# Patient Record
Sex: Male | Born: 1950 | Race: Black or African American | Hispanic: No | Marital: Single | Smoking: Former smoker
Health system: Southern US, Community
[De-identification: ages and names within clinical notes are randomized; demographics above are authoritative.]

## PROBLEM LIST (undated history)

## (undated) DIAGNOSIS — I10 Essential (primary) hypertension: Secondary | ICD-10-CM

---

## 2012-02-22 ENCOUNTER — Encounter (HOSPITAL_COMMUNITY): Payer: Self-pay | Admitting: Emergency Medicine

## 2012-02-22 ENCOUNTER — Emergency Department (HOSPITAL_COMMUNITY)
Admission: EM | Admit: 2012-02-22 | Discharge: 2012-02-22 | Disposition: A | Discharge status: Home / Self Care | Payer: Self-pay | Attending: Emergency Medicine | Admitting: Emergency Medicine

## 2012-02-22 DIAGNOSIS — W208XXA Other cause of strike by thrown, projected or falling object, initial encounter: Secondary | ICD-10-CM | POA: Insufficient documentation

## 2012-02-22 DIAGNOSIS — S0191XA Laceration without foreign body of unspecified part of head, initial encounter: Secondary | ICD-10-CM

## 2012-02-22 DIAGNOSIS — S0100XA Unspecified open wound of scalp, initial encounter: Secondary | ICD-10-CM | POA: Insufficient documentation

## 2012-02-22 NOTE — ED Provider Notes (Signed)
Medical screening examination/treatment/procedure(s) were performed by non-physician practitioner and as supervising physician I was immediately available for consultation/collaboration.   Cyndra Numbers, MD 02/22/12 [phone]

## 2012-02-22 NOTE — ED Notes (Signed)
Pt states he was working in his yard when a ladder fell and struck him on the back of the head. Pt denies loss of consciousness and was not knocked down. Pt with approximately 2" laceration to left posterior scalp, bleeding controlled at this time.  Pt's last Td >10 years ago however pt states he will refuse a Td booster today.

## 2012-02-22 NOTE — ED Provider Notes (Signed)
History     CSN: [id number]  Arrival date & time 02/22/12  1057   First MD Initiated Contact with Patient 02/22/12 1131      Chief Complaint  Patient presents with  . Head Laceration    (Consider location/radiation/quality/duration/timing/severity/associated sxs/prior treatment) HPI Comments: Patient reports he was doing yard work when a ladder fell from being propped up against a tree.  States he ran to get out of the way but it cut the top of his head.  Denies LOC. Denies any focal neurological deficits.  States that he cannot tolerate needles or anything that hurts, refuses tetanus vaccination and refuses repair of the wound with sutures or staples.    Patient is a 61 y.o. male presenting with scalp laceration. The history is provided by the patient.  Head Laceration This is a new problem. The current episode started today. Pertinent negatives include no headaches, numbness or weakness.    History reviewed. No pertinent past medical history.  History reviewed. No pertinent past surgical history.  History reviewed. No pertinent family history.  History  Substance Use Topics  . Smoking status: Not on file  . Smokeless tobacco: Not on file  . Alcohol Use: Not on file      Review of Systems  Skin: Positive for wound.  Neurological: Negative for syncope, weakness, light-headedness, numbness and headaches.    Allergies  Review of patient's allergies indicates no known allergies.  Home Medications  No current outpatient prescriptions on file.  BP 142/84  Pulse 79  Temp 97.9 F (36.6 C) (Oral)  Resp 18  SpO2 97%  Physical Exam  Nursing note and vitals reviewed. Constitutional: He appears well-developed and well-nourished. No distress.  HENT:  Head: Normocephalic. Head is with laceration.    Neck: Neck supple.  Pulmonary/Chest: Effort normal.  Neurological: He is alert. He has normal strength. No cranial nerve deficit or sensory deficit. He exhibits normal  muscle tone. Coordination normal. GCS eye subscore is 4. GCS verbal subscore is 5. GCS motor subscore is 6.  Skin: He is not diaphoretic.    ED Course  Procedures (including critical care time)  Labs Reviewed - No data to display No results found.      1. Laceration of head       MDM  Pt with laceration to head after running away from a falling ladder.  No neurological deficits. I do not think head imaging is necessary at this time.  After long discussion about the dangers of tetanus and of increased risk of infection and continuous bleeding by patient's refusal of tetanus vaccination and repair of wound, pt discharged home with return precautions.  Pt offered LET ointment prior to stapling and he still refused.  Nurse cleaned wound and applied steri-strips, which was not the best repair method but was the only one the patient would agree with.  Pt given return precautions.  Pt verbalizes understanding and agrees with plan.           [place], [place] 02/22/12 1220

## 2018-01-28 ENCOUNTER — Encounter (HOSPITAL_COMMUNITY): Payer: Self-pay | Admitting: *Deleted

## 2018-01-28 ENCOUNTER — Emergency Department (HOSPITAL_COMMUNITY)
Admission: EM | Admit: 2018-01-28 | Discharge: 2018-01-28 | Disposition: A | Discharge status: Home / Self Care | Payer: Self-pay | Attending: Emergency Medicine | Admitting: Emergency Medicine

## 2018-01-28 ENCOUNTER — Emergency Department (HOSPITAL_COMMUNITY): Payer: Self-pay

## 2018-01-28 DIAGNOSIS — M79605 Pain in left leg: Secondary | ICD-10-CM | POA: Insufficient documentation

## 2018-01-28 MED ORDER — HYDROMORPHONE HCL 1 MG/ML IJ SOLN
1.0000 mg | Freq: Once | INTRAMUSCULAR | Status: AC
  Administered 2018-01-28: 1 mg via INTRAMUSCULAR
  Filled 2018-01-28: qty 1

## 2018-01-28 MED ORDER — TRAMADOL HCL 50 MG PO TABS: 50.0000 mg | ORAL_TABLET | Freq: Four times a day (QID) | ORAL | 0 refills | Status: DC | PRN

## 2018-01-28 MED ORDER — KETOROLAC TROMETHAMINE 15 MG/ML IJ SOLN
15.0000 mg | Freq: Once | INTRAMUSCULAR | Status: AC
  Administered 2018-01-28: 15 mg via INTRAMUSCULAR
  Filled 2018-01-28: qty 1

## 2018-01-28 MED ORDER — HYDROCODONE-ACETAMINOPHEN 5-325 MG PO TABS
1.0000 | ORAL_TABLET | Freq: Once | ORAL | Status: AC
  Administered 2018-01-28: 1 via ORAL
  Filled 2018-01-28: qty 1

## 2018-01-28 MED ORDER — METHOCARBAMOL 500 MG PO TABS: 1000.0000 mg | ORAL_TABLET | Freq: Four times a day (QID) | ORAL | 0 refills | Status: DC

## 2018-01-28 MED ORDER — PREDNISONE 20 MG PO TABS: ORAL_TABLET | ORAL | 0 refills | Status: DC

## 2018-01-28 NOTE — ED Provider Notes (Signed)
[facility]-EMERGENCY DEPT Provider Note   CSN: [id number] Arrival date & time: 01/28/18  1006     History   Chief Complaint Chief Complaint  Patient presents with  . Leg Pain    HPI Dalton Parrish is a 67 y.o. male.  Patient brought in by ambulance with complaint of left leg pain.  Patient states that the pain started 2 days ago, acutely, when he stepped up on a ramp.  He had immediate pain starting from the outside of his left hip down the outside of his leg to his knee, described as throbbing and shooting.  No back pain.  He has had difficulty standing and ambulating due to pain today.  He is tried ibuprofen without improvement.  He denies numbness or tingling.  No leg swelling.  No bowel or bladder changes or loss of control.  No similar pain in the past. The onset of this condition was acute. The course is constant. Aggravating factors: none. Alleviating factors: certain positions.       History reviewed. No pertinent past medical history.  There are no active problems to display for this patient.   History reviewed. No pertinent surgical history.      Home Medications    Prior to Admission medications   Not on File    Family History No family history on file.  Social History Social History   Tobacco Use  . Smoking status: Not on file  Substance Use Topics  . Alcohol use: Not on file  . Drug use: Not on file     Allergies   Patient has no known allergies.   Review of Systems Review of Systems  Constitutional: Negative for activity change.  Cardiovascular: Negative for leg swelling.  Musculoskeletal: Positive for arthralgias, gait problem and myalgias. Negative for back pain, joint swelling and neck pain.  Skin: Negative for wound.  Neurological: Negative for weakness and numbness.     Physical Exam Updated Vital Signs BP (!) 143/93 (BP Location: Right Arm)   Pulse 87   Temp 97.8 F (36.6 C) (Oral)   Resp 18   SpO2 100%    Physical Exam  Constitutional: He appears well-developed and well-nourished.  HENT:  Head: Normocephalic and atraumatic.  Eyes: Conjunctivae are normal.  Neck: Normal range of motion.  Cardiovascular:  Pulses:      Dorsalis pedis pulses are 2+ on the right side, and 2+ on the left side.  Abdominal: Soft. There is no tenderness. There is no CVA tenderness.  Musculoskeletal: Normal range of motion.       Left hip: He exhibits tenderness. He exhibits normal range of motion, normal strength and no bony tenderness.       Left knee: He exhibits normal range of motion, no swelling and no effusion. No tenderness found.       Left ankle: Normal.       Cervical back: He exhibits normal range of motion, no tenderness and no bony tenderness.       Thoracic back: He exhibits normal range of motion, no tenderness and no bony tenderness.       Lumbar back: He exhibits normal range of motion, no tenderness and no bony tenderness.       Left upper leg: Normal. He exhibits no tenderness, no bony tenderness and no swelling.       Legs: No step-off noted with palpation of spine.   Neurological: He is alert. He has normal reflexes. No sensory deficit. He  exhibits normal muscle tone.  5/5 strength in entire lower extremities bilaterally. No sensation deficit.   Skin: Skin is warm and dry.  Psychiatric: He has a normal mood and affect.  Nursing note and vitals reviewed.    ED Treatments / Results  Labs (all labs ordered are listed, but only abnormal results are displayed) Labs Reviewed - No data to display  EKG None  Radiology Dg Hip Unilat W Or Wo Pelvis 2-3 Views Left  Result Date: 01/28/2018 CLINICAL DATA:  Left hip and lower extremity pain after stepping. EXAM: DG HIP (WITH OR WITHOUT PELVIS) 2-3V LEFT COMPARISON:  None. FINDINGS: No pelvic fracture or diastasis. Degenerative changes in the visualized lower lumbar spine. No left hip fracture or dislocation. No suspicious focal osseous lesion.  No significant hip arthropathy. IMPRESSION: No fracture. No left hip malalignment. No significant hip arthropathy. Degenerative changes in the visualized lower lumbar spine. Electronically Signed   By: Delbert PhenixJason A Poff M.D.   On: 01/28/2018 11:52    Procedures Procedures (including critical care time)  Medications Ordered in ED Medications  HYDROcodone-acetaminophen (NORCO/VICODIN) 5-325 MG per tablet 1 tablet (1 tablet Oral Given 01/28/18 1052)  ketorolac (TORADOL) 15 MG/ML injection 15 mg (15 mg Intramuscular Given 01/28/18 1055)  HYDROmorphone (DILAUDID) injection 1 mg (1 mg Intramuscular Given 01/28/18 1236)     Initial Impression / Assessment and Plan / ED Course  I have reviewed the triage vital signs and the nursing notes.  Pertinent labs & imaging results that were available during my care of the patient were reviewed by me and considered in my medical decision making (see chart for details).     Patient seen and examined. Work-up initiated. Medications ordered. No reproducible tenderness except over lateral pelvis, prox femur. Will image. Suspect MSK pain vs radicular pain. Doubt DVT given history and exam. Normal pedal pulses. No flank pain to suggest dissection/AAA.   Vital signs reviewed and are as follows: BP (!) 143/93 (BP Location: Right Arm)   Pulse 87   Temp 97.8 F (36.6 C) (Oral)   Resp 18   SpO2 100%   10:52 AM Pt unable to tolerate x-ray 2/2 pain. Awaiting pain med admin.   12:52 PM x-ray negative.  Patient updated.  He is still very uncomfortable.  IM Dilaudid ordered.  [facility] [employer] [facility] [employer] [facility] substance reporting [place].  No previous prescriptions in system.  2:50 PM Patient had improvement in pain.  We will discharged home with prednisone, tramadol, Robaxin.   Encouraged PCP follow-up in 1 week if not improving.  Patient counseled on use of narcotic pain medications. Counseled not to combine these medications with others containing tylenol. Urged not to drink  alcohol, drive, or perform any other activities that requires focus while taking these medications. The patient verbalizes understanding and agrees with the plan.    Final Clinical Impressions(s) / ED Diagnoses   Final diagnoses:  Left leg pain   Patient with acute onset of left leg pain after stepping awkwardly.  Most of his pain is located in the lateral hip area.  X-ray was performed which was negative.  Patient does not have any swelling or signs of DVT.  Symptoms may be musculoskeletal or possibly radicular nature.  Prednisone given for this reason his pain does shoot down the lateral part of his leg.  He does not have any overt back pain.  He appears well.  Lower extremity with distal circulation, motor, sensation intact.  No upper extremity symptoms.  ED Discharge Orders  Ordered    predniSONE (DELTASONE) 20 MG tablet     01/28/18 1421    methocarbamol (ROBAXIN) 500 MG tablet  4 times daily     01/28/18 1421    traMADol (ULTRAM) 50 MG tablet  Every 6 hours PRN     01/28/18 1421       Renne Crigler, PA-C 01/28/18 1452    Rolan Bucco, MD 01/28/18 1455

## 2018-01-28 NOTE — Discharge Instructions (Signed)
Please read and follow all provided instructions.  Your diagnoses today include:  1. Left leg pain     Tests performed today include:  Vital signs - see below for your results today   X-ray of your hip and pelvic - no broken bones  Medications prescribed:   Tramadol - narcotic-like pain medication  DO NOT drive or perform any activities that require you to be awake and alert because this medicine can make you drowsy.    Robaxin (methocarbamol) - muscle relaxer medication  DO NOT drive or perform any activities that require you to be awake and alert because this medicine can make you drowsy.    Prednisone - steroid medicine   It is best to take this medication in the morning to prevent sleeping problems. If you are diabetic, monitor your blood sugar closely and stop taking Prednisone if blood sugar is over 300. Take with food to prevent stomach upset.   Take any prescribed medications only as directed.  Home care instructions:   Follow any educational materials contained in this packet  Please rest, use ice or heat on your back for the next several days  Do not lift, push, pull anything more than 10 pounds for the next week  Follow-up instructions: Please follow-up with your primary care provider in the next 1 week for further evaluation of your symptoms.   Return instructions:  SEEK IMMEDIATE MEDICAL ATTENTION IF YOU HAVE:  New numbness, tingling, weakness, or problem with the use of your arms or legs  Severe back pain not relieved with medications  Loss control of your bowels or bladder  Increasing pain in any areas of the body (such as chest or abdominal pain)  Shortness of breath, dizziness, or fainting.   Worsening nausea (feeling sick to your stomach), vomiting, fever, or sweats  Any other emergent concerns regarding your health   Additional Information:  Your vital signs today were: BP (!) 114/105 (BP Location: Right Arm)    Pulse 86    Temp 97.8 F  (36.6 C) (Oral)    Resp 15    SpO2 95%  If your blood pressure (BP) was elevated above 135/85 this visit, please have this repeated by your doctor within one month. --------------

## 2018-01-28 NOTE — ED Triage Notes (Signed)
Per EMS, pt complains of left hip to knee pain x 2 days. Pain began when pt was walking down a ramp. Pt has tried ibuprofen w/o relief. Pt fell d/t leg pain, denies injury. Pt denies head injury or loss of consciousness.   BP 150/90 HR 90

## 2020-10-09 ENCOUNTER — Other Ambulatory Visit: Payer: Self-pay

## 2020-10-09 ENCOUNTER — Inpatient Hospital Stay (HOSPITAL_COMMUNITY)
Admission: EM | Admit: 2020-10-09 | Discharge: 2020-10-12 | DRG: 175 | Disposition: A | Discharge status: Home / Self Care | Payer: Medicare Other | Attending: Student | Admitting: Student

## 2020-10-09 ENCOUNTER — Encounter (HOSPITAL_COMMUNITY): Payer: Self-pay | Admitting: Emergency Medicine

## 2020-10-09 ENCOUNTER — Emergency Department (HOSPITAL_COMMUNITY): Payer: Medicare Other

## 2020-10-09 DIAGNOSIS — Z79899 Other long term (current) drug therapy: Secondary | ICD-10-CM | POA: Diagnosis not present

## 2020-10-09 DIAGNOSIS — R739 Hyperglycemia, unspecified: Secondary | ICD-10-CM | POA: Diagnosis not present

## 2020-10-09 DIAGNOSIS — Z6828 Body mass index (BMI) 28.0-28.9, adult: Secondary | ICD-10-CM

## 2020-10-09 DIAGNOSIS — I2699 Other pulmonary embolism without acute cor pulmonale: Secondary | ICD-10-CM | POA: Diagnosis not present

## 2020-10-09 DIAGNOSIS — I160 Hypertensive urgency: Secondary | ICD-10-CM | POA: Diagnosis present

## 2020-10-09 DIAGNOSIS — B9620 Unspecified Escherichia coli [E. coli] as the cause of diseases classified elsewhere: Secondary | ICD-10-CM | POA: Diagnosis present

## 2020-10-09 DIAGNOSIS — R030 Elevated blood-pressure reading, without diagnosis of hypertension: Secondary | ICD-10-CM | POA: Diagnosis present

## 2020-10-09 DIAGNOSIS — J9601 Acute respiratory failure with hypoxia: Secondary | ICD-10-CM | POA: Diagnosis present

## 2020-10-09 DIAGNOSIS — N3001 Acute cystitis with hematuria: Secondary | ICD-10-CM | POA: Diagnosis present

## 2020-10-09 DIAGNOSIS — Z20822 Contact with and (suspected) exposure to covid-19: Secondary | ICD-10-CM | POA: Diagnosis present

## 2020-10-09 DIAGNOSIS — N390 Urinary tract infection, site not specified: Secondary | ICD-10-CM | POA: Diagnosis present

## 2020-10-09 DIAGNOSIS — Z87891 Personal history of nicotine dependence: Secondary | ICD-10-CM | POA: Diagnosis not present

## 2020-10-09 DIAGNOSIS — Z7952 Long term (current) use of systemic steroids: Secondary | ICD-10-CM | POA: Diagnosis not present

## 2020-10-09 DIAGNOSIS — J9600 Acute respiratory failure, unspecified whether with hypoxia or hypercapnia: Secondary | ICD-10-CM | POA: Diagnosis present

## 2020-10-09 DIAGNOSIS — I10 Essential (primary) hypertension: Secondary | ICD-10-CM | POA: Diagnosis present

## 2020-10-09 DIAGNOSIS — J441 Chronic obstructive pulmonary disease with (acute) exacerbation: Secondary | ICD-10-CM | POA: Diagnosis present

## 2020-10-09 DIAGNOSIS — T380X5A Adverse effect of glucocorticoids and synthetic analogues, initial encounter: Secondary | ICD-10-CM | POA: Diagnosis not present

## 2020-10-09 DIAGNOSIS — Y92239 Unspecified place in hospital as the place of occurrence of the external cause: Secondary | ICD-10-CM | POA: Diagnosis not present

## 2020-10-09 DIAGNOSIS — E663 Overweight: Secondary | ICD-10-CM | POA: Diagnosis present

## 2020-10-09 LAB — TROPONIN I (HIGH SENSITIVITY)
Troponin I (High Sensitivity): 4 ng/L (ref <18)
Troponin I (High Sensitivity): 6 ng/L (ref <18)

## 2020-10-09 LAB — CBC
HCT: 40 % (ref 39.0–52.0)
Hemoglobin: 13.5 g/dL (ref 13.0–17.0)
MCH: 28.5 pg (ref 26.0–34.0)
MCHC: 33.8 g/dL (ref 30.0–36.0)
MCV: 84.4 fL (ref 80.0–100.0)
Platelets: 223 10*3/uL (ref 150–400)
RBC: 4.74 MIL/uL (ref 4.22–5.81)
RDW: 14.2 % (ref 11.5–15.5)
WBC: 10.7 10*3/uL — ABNORMAL HIGH (ref 4.0–10.5)
nRBC: 0 % (ref 0.0–0.2)

## 2020-10-09 LAB — URINALYSIS, ROUTINE W REFLEX MICROSCOPIC
Bilirubin Urine: NEGATIVE
Glucose, UA: NEGATIVE mg/dL
Ketones, ur: NEGATIVE mg/dL
Nitrite: POSITIVE — AB
Protein, ur: 30 mg/dL — AB
Specific Gravity, Urine: 1.013 (ref 1.005–1.030)
pH: 5 (ref 5.0–8.0)

## 2020-10-09 LAB — RESP PANEL BY RT-PCR (FLU A&B, COVID) ARPGX2
Influenza A by PCR: NEGATIVE
Influenza B by PCR: NEGATIVE
SARS Coronavirus 2 by RT PCR: NEGATIVE

## 2020-10-09 LAB — COMPREHENSIVE METABOLIC PANEL
ALT: 16 U/L (ref 0–44)
AST: 18 U/L (ref 15–41)
Albumin: 4.6 g/dL (ref 3.5–5.0)
Alkaline Phosphatase: 79 U/L (ref 38–126)
Anion gap: 13 (ref 5–15)
BUN: 16 mg/dL (ref 8–23)
CO2: 22 mmol/L (ref 22–32)
Calcium: 9.6 mg/dL (ref 8.9–10.3)
Chloride: 105 mmol/L (ref 98–111)
Creatinine, Ser: 1.16 mg/dL (ref 0.61–1.24)
GFR, Estimated: >60 mL/min (ref >60)
Glucose, Bld: 150 mg/dL — ABNORMAL HIGH (ref 70–99)
Potassium: 4 mmol/L (ref 3.5–5.1)
Sodium: 140 mmol/L (ref 135–145)
Total Bilirubin: 1.8 mg/dL — ABNORMAL HIGH (ref 0.3–1.2)
Total Protein: 9 g/dL — ABNORMAL HIGH (ref 6.5–8.1)

## 2020-10-09 LAB — PROTIME-INR
INR: 1.2 (ref 0.8–1.2)
Prothrombin Time: 14.8 seconds (ref 11.4–15.2)

## 2020-10-09 LAB — LIPASE, BLOOD: Lipase: 22 U/L (ref 11–51)

## 2020-10-09 LAB — APTT: aPTT: 33 seconds (ref 24–36)

## 2020-10-09 LAB — HEPARIN LEVEL (UNFRACTIONATED): Heparin Unfractionated: <0.1 IU/mL — ABNORMAL LOW (ref 0.30–0.70)

## 2020-10-09 LAB — LACTIC ACID, PLASMA
Lactic Acid, Venous: 2.4 mmol/L (ref 0.5–1.9)
Lactic Acid, Venous: 1.6 mmol/L (ref 0.5–1.9)

## 2020-10-09 MED ORDER — SODIUM CHLORIDE 0.9 % IV BOLUS
500.0000 mL | Freq: Once | INTRAVENOUS | Status: AC
  Administered 2020-10-09: 500 mL via INTRAVENOUS

## 2020-10-09 MED ORDER — MORPHINE SULFATE (PF) 2 MG/ML IV SOLN
1.0000 mg | INTRAVENOUS | Status: DC | PRN
Start: 2020-10-09 — End: 2020-10-12
  Administered 2020-10-10: 1 mg via INTRAVENOUS
  Filled 2020-10-09: qty 1

## 2020-10-09 MED ORDER — SODIUM CHLORIDE 0.9 % IV BOLUS
1000.0000 mL | Freq: Once | INTRAVENOUS | Status: AC
  Administered 2020-10-09: 1000 mL via INTRAVENOUS

## 2020-10-09 MED ORDER — HEPARIN BOLUS VIA INFUSION
2700.0000 [IU] | INTRAVENOUS | Status: AC
  Administered 2020-10-09: 2700 [IU] via INTRAVENOUS
  Filled 2020-10-09: qty 2700

## 2020-10-09 MED ORDER — MORPHINE SULFATE (PF) 4 MG/ML IV SOLN
4.0000 mg | Freq: Once | INTRAVENOUS | Status: AC
  Administered 2020-10-09: 4 mg via INTRAVENOUS
  Filled 2020-10-09: qty 1

## 2020-10-09 MED ORDER — FENTANYL CITRATE (PF) 100 MCG/2ML IJ SOLN
50.0000 ug | Freq: Once | INTRAMUSCULAR | Status: AC
  Administered 2020-10-09: 50 ug via INTRAVENOUS
  Filled 2020-10-09: qty 2

## 2020-10-09 MED ORDER — IOHEXOL 300 MG/ML  SOLN
100.0000 mL | Freq: Once | INTRAMUSCULAR | Status: AC | PRN
  Administered 2020-10-09: 100 mL via INTRAVENOUS

## 2020-10-09 MED ORDER — CHLORHEXIDINE GLUCONATE 0.12 % MT SOLN
15.0000 mL | Freq: Two times a day (BID) | OROMUCOSAL | Status: DC
  Administered 2020-10-10 – 2020-10-12 (×5): 15 mL via OROMUCOSAL
  Filled 2020-10-09 (×6): qty 15

## 2020-10-09 MED ORDER — ACETAMINOPHEN 325 MG PO TABS: 650.0000 mg | ORAL_TABLET | Freq: Four times a day (QID) | ORAL | Status: DC | PRN

## 2020-10-09 MED ORDER — POLYETHYLENE GLYCOL 3350 17 G PO PACK: 17.0000 g | PACK | Freq: Every day | ORAL | Status: DC | PRN

## 2020-10-09 MED ORDER — PREDNISONE 20 MG PO TABS
40.0000 mg | ORAL_TABLET | Freq: Every day | ORAL | Status: DC
  Administered 2020-10-11 – 2020-10-12 (×2): 40 mg via ORAL
  Filled 2020-10-09 (×2): qty 2

## 2020-10-09 MED ORDER — LABETALOL HCL 5 MG/ML IV SOLN
20.0000 mg | Freq: Once | INTRAVENOUS | Status: AC
  Administered 2020-10-09: 20 mg via INTRAVENOUS
  Filled 2020-10-09: qty 4

## 2020-10-09 MED ORDER — IOHEXOL 350 MG/ML SOLN
75.0000 mL | Freq: Once | INTRAVENOUS | Status: AC | PRN
  Administered 2020-10-09: 75 mL via INTRAVENOUS

## 2020-10-09 MED ORDER — ACETAMINOPHEN 650 MG RE SUPP: 650.0000 mg | Freq: Four times a day (QID) | RECTAL | Status: DC | PRN

## 2020-10-09 MED ORDER — IPRATROPIUM-ALBUTEROL 0.5-2.5 (3) MG/3ML IN SOLN
3.0000 mL | Freq: Four times a day (QID) | RESPIRATORY_TRACT | Status: DC
  Administered 2020-10-09 – 2020-10-10 (×5): 3 mL via RESPIRATORY_TRACT
  Filled 2020-10-09 (×5): qty 3

## 2020-10-09 MED ORDER — METOPROLOL TARTRATE 25 MG PO TABS
25.0000 mg | ORAL_TABLET | Freq: Two times a day (BID) | ORAL | Status: DC
  Administered 2020-10-09 – 2020-10-12 (×6): 25 mg via ORAL
  Filled 2020-10-09 (×6): qty 1

## 2020-10-09 MED ORDER — METHYLPREDNISOLONE SODIUM SUCC 125 MG IJ SOLR
60.0000 mg | Freq: Four times a day (QID) | INTRAMUSCULAR | Status: AC
  Administered 2020-10-09 – 2020-10-10 (×4): 60 mg via INTRAVENOUS
  Filled 2020-10-09 (×4): qty 2

## 2020-10-09 MED ORDER — ORAL CARE MOUTH RINSE
15.0000 mL | Freq: Two times a day (BID) | OROMUCOSAL | Status: DC
  Administered 2020-10-10 – 2020-10-11 (×3): 15 mL via OROMUCOSAL

## 2020-10-09 MED ORDER — HYDROCODONE-ACETAMINOPHEN 5-325 MG PO TABS
1.0000 | ORAL_TABLET | ORAL | Status: DC | PRN
Start: 2020-10-09 — End: 2020-10-12
  Administered 2020-10-10: 2 via ORAL
  Filled 2020-10-09: qty 2

## 2020-10-09 MED ORDER — HEPARIN (PORCINE) 25000 UT/250ML-% IV SOLN
1900.0000 [IU]/h | INTRAVENOUS | Status: DC
  Administered 2020-10-09: 1550 [IU]/h via INTRAVENOUS
  Administered 2020-10-10: 1900 [IU]/h via INTRAVENOUS
  Filled 2020-10-09 (×3): qty 250

## 2020-10-09 MED ORDER — LOSARTAN POTASSIUM 50 MG PO TABS
50.0000 mg | ORAL_TABLET | Freq: Every day | ORAL | Status: DC
  Administered 2020-10-09 – 2020-10-12 (×4): 50 mg via ORAL
  Filled 2020-10-09 (×4): qty 1

## 2020-10-09 MED ORDER — SODIUM CHLORIDE 0.9 % IV SOLN
1.0000 g | Freq: Once | INTRAVENOUS | Status: AC
  Administered 2020-10-09: 1 g via INTRAVENOUS
  Filled 2020-10-09: qty 10

## 2020-10-09 MED ORDER — SODIUM CHLORIDE 0.9 % IV SOLN
1.0000 g | INTRAVENOUS | Status: DC
  Administered 2020-10-10 – 2020-10-11 (×2): 1 g via INTRAVENOUS
  Filled 2020-10-09: qty 10
  Filled 2020-10-09: qty 0.05

## 2020-10-09 NOTE — ED Provider Notes (Signed)
Care assumed from [facility], [place] at shift change with CTA pending.   In brief, this patient is a 70 y.o. M who presents for evaluation of right-sided flank pain that has been constant in nature.  He states it radiates to his abdomen.  No alleviating or aggravating factors.  He denies any fevers, chest pain, shortness of breath, nausea/vomiting.  Please see note from previous provider for full history/physical exam.  Physical Exam  BP (!) 158/101   Pulse 96   Temp 98.3 F (36.8 C) (Oral)   Resp (!) 39   Ht 6' (1.829 m)   Wt 95.3 kg   SpO2 96%   BMI 28.48 kg/m   Physical Exam  ED Course/Procedures     Procedures   Results for orders placed or performed during the hospital encounter of 10/09/20 (from the past 24 hour(s))  Lipase, blood     Status: None   Collection Time: 10/09/20 12:35 PM  Result Value Ref Range   Lipase 22 11 - 51 U/L  Comprehensive metabolic panel     Status: Abnormal   Collection Time: 10/09/20 12:35 PM  Result Value Ref Range   Sodium 140 135 - 145 mmol/L   Potassium 4.0 3.5 - 5.1 mmol/L   Chloride 105 98 - 111 mmol/L   CO2 22 22 - 32 mmol/L   Glucose, Bld 150 (H) 70 - 99 mg/dL   BUN 16 8 - 23 mg/dL   Creatinine, Ser 0.34 0.61 - 1.24 mg/dL   Calcium 9.6 8.9 - 74.2 mg/dL   Total Protein 9.0 (H) 6.5 - 8.1 g/dL   Albumin 4.6 3.5 - 5.0 g/dL   AST 18 15 - 41 U/L   ALT 16 0 - 44 U/L   Alkaline Phosphatase 79 38 - 126 U/L   Total Bilirubin 1.8 (H) 0.3 - 1.2 mg/dL   GFR, Estimated >59 >56 mL/min   Anion gap 13 5 - 15  CBC     Status: Abnormal   Collection Time: 10/09/20 12:35 PM  Result Value Ref Range   WBC 10.7 (H) 4.0 - 10.5 K/uL   RBC 4.74 4.22 - 5.81 MIL/uL   Hemoglobin 13.5 13.0 - 17.0 g/dL   HCT 38.7 56.4 - 33.2 %   MCV 84.4 80.0 - 100.0 fL   MCH 28.5 26.0 - 34.0 pg   MCHC 33.8 30.0 - 36.0 g/dL   RDW 95.1 88.4 - 16.6 %   Platelets 223 150 - 400 K/uL   nRBC 0.0 0.0 - 0.2 %  Troponin I (High Sensitivity)     Status: None   Collection  Time: 10/09/20 12:35 PM  Result Value Ref Range   Troponin I (High Sensitivity) 4 <18 ng/L  Urinalysis, Routine w reflex microscopic Urine, Clean Catch     Status: Abnormal   Collection Time: 10/09/20  1:11 PM  Result Value Ref Range   Color, Urine YELLOW YELLOW   APPearance HAZY (A) CLEAR   Specific Gravity, Urine 1.013 1.005 - 1.030   pH 5.0 5.0 - 8.0   Glucose, UA NEGATIVE NEGATIVE mg/dL   Hgb urine dipstick SMALL (A) NEGATIVE   Bilirubin Urine NEGATIVE NEGATIVE   Ketones, ur NEGATIVE NEGATIVE mg/dL   Protein, ur 30 (A) NEGATIVE mg/dL   Nitrite POSITIVE (A) NEGATIVE   Leukocytes,Ua TRACE (A) NEGATIVE   RBC / HPF 0-5 0 - 5 RBC/hpf   WBC, UA 11-20 0 - 5 WBC/hpf   Bacteria, UA MANY (A) NONE SEEN  APTT     Status: None   Collection Time: 10/09/20  3:30 PM  Result Value Ref Range   aPTT 33 24 - 36 seconds  Protime-INR     Status: None   Collection Time: 10/09/20  3:30 PM  Result Value Ref Range   Prothrombin Time 14.8 11.4 - 15.2 seconds   INR 1.2 0.8 - 1.2  Resp Panel by RT-PCR (Flu A&B, Covid) Nasopharyngeal Swab     Status: None   Collection Time: 10/09/20  3:32 PM   Specimen: Nasopharyngeal Swab; Nasopharyngeal(NP) swabs in vial transport medium  Result Value Ref Range   SARS Coronavirus 2 by RT PCR NEGATIVE NEGATIVE   Influenza A by PCR NEGATIVE NEGATIVE   Influenza B by PCR NEGATIVE NEGATIVE  Lactic acid, plasma     Status: Abnormal   Collection Time: 10/09/20  3:38 PM  Result Value Ref Range   Lactic Acid, Venous 2.4 (HH) 0.5 - 1.9 mmol/L    MDM    PLAN: Patient will be admitted pending CTA of chest.  He has been maintaining good O2 sats here in the ED.  He was placed on oxygen for comfort care.  MDM:  Theodis Aguas is negative.  UA shows nitrites, leukocytes, bacteria.  Patient started on Rocephin.  CMP is unremarkable.  Lipase is unremarkable.  CBC shows leukocytosis of 10.7.  CTA of chest shows pulmonary embolism without evidence of saddle embolus or right heart  strain.  Patient started on heparin.  We will plan to admit.  Discussed patient with Dr. Isidoro Donning (hospitalist) who accepts patient for admission.   1. Acute cystitis with hematuria   2. Acute pulmonary embolism, unspecified pulmonary embolism type, unspecified whether acute cor pulmonale present [facility])    Portions of this note were generated with Dragon dictation software. Dictation errors may occur despite best attempts at proofreading.   Maxwell Caul, PA-C 10/09/20 1656    Milagros Loll, MD 10/13/20 [phone]

## 2020-10-09 NOTE — H&P (Signed)
History and Physical        Hospital Admission Note Date: 10/09/2020  Patient name: Dalton Parrish Medical record number: [id number] Date of birth: 02/19/1951 Age: 70 y.o. Gender: male  PCP: Patient, No Pcp Per, patient has no PCP    Patient coming from:  home  I have reviewed all records in the [facility].    Chief Complaint:  Right-sided flank pain and pleuritic lower chest pain  HPI: Patient is a 70 year old male with prior history of hypertension, has not been going to primary care, not on any meds stated that he has chronic right-sided flank pain however it got worse 2 days ago.  States no nausea vomiting or diarrhea but noticed that the pain was worse with coughing or deep breathing.  He described the pain as constant sharp pain radiating down to his lower abdomen, pain has been gradually worsening, currently 8/10.  No dysuria or hematuria.  No fevers. Denies any personal history of clots, DVT or PE.  No long distance car travel or flights.  States he is otherwise functionally active.  Denies any family history of clots. States he did not get Covid vaccine and does not know if he ever had Covid  ED work-up/course:  Vital signs temp 98.3, respiratory rate 35, pulse 101-104, BP 161/107  Sodium 140, glucose 150, creatinine 1.16, LFTs normal, lipase 22.  WBC 7.7, hemoglobin 13.5 INR 1.2 UA positive for UTI COVID-19 test negative CTA chest showed marked severity right-sided pulmonary embolism without evidence of saddle embolus or right heart strain.  Mild to moderate severe edema bilateral atelectasis and/or infiltrates, emphysema, small hiatal hernia  CT abdomen pelvis negative for acute intra-abdominal process  Review of Systems: Positives marked in 'bold' Constitutional: Denies fever, chills, diaphoresis, poor appetite and fatigue.  HEENT: Denies photophobia, eye  pain, redness, hearing loss, ear pain, congestion, sore throat, rhinorrhea, sneezing, mouth sores, trouble swallowing, neck pain, neck stiffness and tinnitus.   Respiratory:+ Wheezing with pleuritic chest pain .   Cardiovascular: Denies  palpitations and leg swelling.  Gastrointestinal: Denies nausea, vomiting, diarrhea, constipation, blood in stool and abdominal distention. + Right sided flank pain Genitourinary: Denies dysuria, urgency, frequency, hematuria, flank pain and difficulty urinating.  Musculoskeletal: Denies myalgias, back pain, joint swelling, arthralgias and gait problem.  Skin: Denies pallor, rash and wound.  Neurological: Denies dizziness, seizures, syncope, weakness, light-headedness, numbness and headaches.  Hematological: Denies adenopathy. Easy bruising, personal or family bleeding history  Psychiatric/Behavioral: Denies suicidal ideation, mood changes, confusion, nervousness, sleep disturbance and agitation  Past Medical History: Patient states he had history of hypertension but does not take any meds.  He does not follow primary care.  Past surgical history History reviewed. No pertinent surgical history.  Medications: Prior to Admission medications   Medication Sig Start Date End Date Taking? Authorizing Provider  methocarbamol (ROBAXIN) 500 MG tablet Take 2 tablets (1,000 mg total) by mouth 4 (four) times daily. Patient not taking: No sig reported 01/28/18   Renne Crigler, PA-C  predniSONE (DELTASONE) 20 MG tablet 3 Tabs PO Days 1-3, then 2 tabs PO Days 4-6, then 1 tab PO Day 7-9, then Half Tab PO Day 10-12  Patient not taking: No sig reported 01/28/18   Renne CriglerGeiple, Joshua, PA-C  traMADol (ULTRAM) 50 MG tablet Take 1 tablet (50 mg total) by mouth every 6 (six) hours as needed. Patient not taking: No sig reported 01/28/18   Renne CriglerGeiple, Joshua, PA-C    Allergies:  No Known Allergies  Social History: Patient reports smoking, states sometimes he heavily smokes like a freight  train  reports previous alcohol use. He reports previous drug use.   Family History: States he has history of lung cancer on his father's side but he does not know much family history   Physical Exam: Blood pressure (!) 158/101, pulse 96, temperature 98.3 F (36.8 C), temperature source Oral, resp. rate (!) 39, height 6' (1.829 m), weight 95.3 kg, SpO2 96 %. General: Alert, awake, oriented x3, in mild distress due to pain Eyes: pink conjunctiva,anicteric sclera, pupils equal and reactive to light and accomodation, HEENT: normocephalic, atraumatic, oropharynx clear Neck: supple, no masses or lymphadenopathy, no goiter, no bruits, no JVD CVS: Regular rate and rhythm, without murmurs, rubs or gallops. No lower extremity edema Resp : Diminished breath sounds throughout with slight expiratory wheezing GI : Mild generalized abdominal TTP, no guarding or rebound tenderness, positive bowel sounds Musculoskeletal: No clubbing or cyanosis, positive pedal pulses. No contracture. ROM intact  Neuro: Grossly intact, no focal neurological deficits, strength 5/5 upper and lower extremities bilaterally Psych: alert and oriented x 3, normal mood and affect Skin: no rashes or lesions, warm and dry   LABS on Admission: I have personally reviewed all the labs and imagings below    Basic Metabolic Panel: Recent Labs  Lab 10/09/20 1235  NA 140  K 4.0  CL 105  CO2 22  GLUCOSE 150*  BUN 16  CREATININE 1.16  CALCIUM 9.6   Liver Function Tests: Recent Labs  Lab 10/09/20 1235  AST 18  ALT 16  ALKPHOS 79  BILITOT 1.8*  PROT 9.0*  ALBUMIN 4.6   Recent Labs  Lab 10/09/20 1235  LIPASE 22   No results for input(s): AMMONIA in the last 168 hours. CBC: Recent Labs  Lab 10/09/20 1235  WBC 10.7*  HGB 13.5  HCT 40.0  MCV 84.4  PLT 223   Cardiac Enzymes: No results for input(s): CKTOTAL, CKMB, CKMBINDEX, TROPONINI in the last 168 hours. BNP: Invalid input(s): POCBNP CBG: No results for  input(s): GLUCAP in the last 168 hours.  Radiological Exams on Admission:  CT Angio Chest PE W and/or Wo Contrast  Result Date: 10/09/2020 CLINICAL DATA:  Shortness of breath and wheezing. EXAM: CT ANGIOGRAPHY CHEST WITH CONTRAST TECHNIQUE: Multidetector CT imaging of the chest was performed using the standard protocol during bolus administration of intravenous contrast. Multiplanar CT image reconstructions and MIPs were obtained to evaluate the vascular anatomy. CONTRAST:  75mL OMNIPAQUE IOHEXOL 350 MG/ML SOLN COMPARISON:  None. FINDINGS: Cardiovascular: The thoracic aorta is normal in appearance. Marked severity intraluminal filling defects are seen involving the distal aspect of the right pulmonary artery. Extensive involvement of the middle lobe and lower lobe branches is seen. There is no evidence of saddle embolus. Normal heart size without evidence of right heart strain. No pericardial effusion. Mediastinum/Nodes: No enlarged mediastinal, hilar, or axillary lymph nodes. Thyroid gland, trachea, and esophagus demonstrate no significant findings. Lungs/Pleura: There is mild emphysematous lung disease. Mild areas of atelectasis and/or infiltrate are seen within the inferior aspect of the left upper lobe and posterior aspects of the right upper lobe, right middle lobe and left  lung base. Moderate severity atelectasis and/or infiltrate is seen within the right lower lobe. There is no evidence of a pleural effusion or pneumothorax. Upper Abdomen: There is a small hiatal hernia. Musculoskeletal: A 10.5 cm x 3.2 cm subcutaneous lipoma is seen within the soft tissues of the back, to the left of midline. Degenerative changes seen throughout the thoracic spine. Review of the MIP images confirms the above findings. IMPRESSION: 1. Marked severity right-sided pulmonary embolism without evidence of saddle embolus or right heart strain. 2. Mild to moderate severity bilateral atelectasis and/or infiltrate. 3. Small hiatal  hernia. 4. Emphysema. Emphysema (ICD10-J43.9). Electronically Signed   By: Aram Candela M.D.   On: 10/09/2020 16:02   CT ABDOMEN PELVIS W CONTRAST  Result Date: 10/09/2020 CLINICAL DATA:  70 year old male with abdominal infection suspected EXAM: CT ABDOMEN AND PELVIS WITH CONTRAST TECHNIQUE: Multidetector CT imaging of the abdomen and pelvis was performed using the standard protocol following bolus administration of intravenous contrast. CONTRAST:  OMNIPAQUE IOHEXOL 300 MG/ML  SOLN COMPARISON:  None. FINDINGS: Lower chest: Peripheral nodular opacities at the right costophrenic sulcus, within the right lower lobe. Partial volume loss of the right lower lobe and the right middle lobe. Questionable hypodensity linear filling defects within the pulmonary arteries of the right lower lobe. These are better appreciated on the coronal images. Hepatobiliary: Subcentimeter rounded hypodensity of the right liver dome, most likely a benign biliary cyst though nonspecific. Gallbladder is decompressed. No inflammatory changes at the gallbladder fossa. Pancreas: Unremarkable pancreas Spleen: Unremarkable spleen Adrenals/Urinary Tract: - Right adrenal gland:  Unremarkable - Left adrenal gland: Unremarkable. - Right kidney: No hydronephrosis, nephrolithiasis, inflammation, or ureteral dilation. Subcentimeter hypodense lesion at the anterior cortex of the right kidney, most likely a benign cyst though strictly too small to characterize. - Left Kidney: No hydronephrosis, nephrolithiasis, inflammation, or ureteral dilation. Subcentimeter hypodense lesion at the posterior cortex of the left kidney at the hilum, most likely benign cyst though strictly too small to characterize. - Urinary Bladder: Unremarkable. Stomach/Bowel: - Stomach: Stomach demonstrates some gaseous distension though otherwise unremarkable. Hiatal hernia. No evidence of gastric outlet obstruction. - Small bowel: Gas filled small bowel loops distally  without transition point or wall thickening. No focal inflammatory changes or interloop fluid. - Appendix: Normal. - Colon: Formed stool within right colon sigmoid colon and rectum. Mild gaseous distension of the transverse colon. No focal wall thickening or focal inflammatory changes. Vascular/Lymphatic: Atherosclerotic changes of the abdominal aorta and the iliac arteries. Bilateral iliac arteries and proximal femoral arteries are patent. Reproductive: Unremarkable prostate Other: Bilateral fat containing inguinal hernia. Fat containing umbilical hernia without inflammatory changes or evidence of obstruction. Musculoskeletal: Degenerative changes of the visualized thoracolumbar spine. No acute displaced fracture. Vacuum disc phenomenon at L2-L3, L4-L5, L5-S1. IMPRESSION: CT negative for acute intra-abdominal process. Incidentally imaged filling defects of the right lower lobe and right middle lobe, which are favored to represent pulmonary emboli and associated right middle lobe/lower lobe consolidation. Further evaluation with a dedicated CTA chest PE protocol is indicated for confirmation. These results were discussed by telephone at the time of interpretation on 10/09/2020 at 2:56 pm with [facility],. Borderline dilated small bowel and colon, without evidence of obstruction. This is nonspecific, potentially representing enteritis, slow transit of bowel contents, in/or reflective of constipation/obstipation. Electronically Signed   By: Gilmer Mor D.O.   On: 10/09/2020 15:05      EKG: Independently reviewed.  Rate 107, sinus tachycardia, LVH*   Assessment/Plan Principal Problem:  Acute respiratory failure (HCC) secondary to right-sided pulmonary embolism -CTA chest showed moderate severity right-sided pulmonary embolism without evidence of saddle embolus or right heart strain -Currently placed on O2 3 L, started on IV heparin drip per pharmacy -No provoking risk factors for PE -Obtain 2D  echocardiogram and venous Dopplers LE for further work-up  Active Problems:    Acute lower UTI -Follow urine culture and sensitivities, placed on IV Rocephin  Nicotine abuse, COPD exacerbation -On exam, diminished breath sounds with slight expiratory wheezing bilaterally, has a smoking history -Placed on IV Solu-Medrol, currently on IV Rocephin for UTI, placed on scheduled DuoNebs    Accelerated hypertension -Patient had a history of hypertension but not taking any meds -Labetalol 20 mg IV x1, placed on losartan 50 mg daily, metoprolol 25 mg BID  Hyperglycemia -CBG 150, no prior history of diabetes -Obtain hemoglobin A1c in a.m. -Follow lipid panel in a.m.   DVT prophylaxis: On therapeutic IV heparin drip  CODE STATUS: Discussed in detail with the patient, on full CODE STATUS  Consults called: None  Family Communication: Admission, patients condition and plan of care including tests being ordered have been discussed with the patient who indicates understanding and agree with the plan and Code Status  Admission status:   The medical decision making on this patient was of high complexity and the patient is at high risk for clinical deterioration, therefore this is a level 3 admission.  Severity of Illness:      The appropriate patient status for this patient is INPATIENT. Inpatient status is judged to be reasonable and necessary in order to provide the required intensity of service to ensure the patient's safety. The patient's presenting symptoms, physical exam findings, and initial radiographic and laboratory data in the context of their chronic comorbidities is felt to place them at high risk for further clinical deterioration. Furthermore, it is not anticipated that the patient will be medically stable for discharge from the hospital within 2 midnights of admission. The following factors support the patient status of inpatient.   " The patient's presenting symptoms include  pleuritic chest pain, found to have moderately severe right-sided pulmonary embolism. " The worrisome physical exam findings include acute pulmonary embolism, hypoxia, wheezing bilaterally " The initial radiographic and laboratory data are worrisome because of CT angiogram positive for acute PE " The chronic co-morbidities include none, patient does not go to primary care   * I certify that at the point of admission it is my clinical judgment that the patient will require inpatient hospital care spanning beyond 2 midnights from the point of admission due to high intensity of service, high risk for further deterioration and high frequency of surveillance required.*    Time Spent on Admission: 65 minutes     Ellery Tash M.D. [facility] 10/09/2020, 5:07 PM

## 2020-10-09 NOTE — ED Triage Notes (Signed)
Per pt, states abdominal pain radiating to right flank-symptoms started Saturday--no dysuria, no N/V/D

## 2020-10-09 NOTE — Progress Notes (Signed)
ANTICOAGULATION CONSULT NOTE - Initial Consult  Pharmacy Consult for Heparin Indication: pulmonary embolus  No Known Allergies  Patient Measurements: Height: 6' (182.9 cm) Weight: 95.3 kg (210 lb) IBW/kg (Calculated) : 77.6 Heparin Dosing Weight: TBW  Vital Signs: Temp: 98.3 F (36.8 C) (03/14 1142) Temp Source: Oral (03/14 1142) BP: 152/113 (03/14 1419) Pulse Rate: 101 (03/14 1419)  Labs: Recent Labs    10/09/20 1235  HGB 13.5  HCT 40.0  PLT 223  CREATININE 1.16    Estimated Creatinine Clearance: 74 mL/min (by C-G formula based on SCr of 1.16 mg/dL).   Medical History: History reviewed. No pertinent past medical history.  Medications:  Scheduled:   Infusions:  . cefTRIAXone (ROCEPHIN)  IV    . sodium chloride      Assessment: 63 yoM admitted on 3/14 with abdominal pain and CT abdomen incidentally shows RLL and RML filling defects which may represent PE.  CTa and baseline coags pending.  No known use of prior to admission anticoagulation.  Baseline CBC wnl.   Goal of Therapy:  Heparin level 0.3-0.7 units/ml Monitor platelets by anticoagulation protocol: Yes   Plan:  Baseline PTT, PT/INR Give heparin 2700 units bolus IV x 1 Start heparin IV infusion at 1550 units/hr Heparin level 6 hours after starting Daily heparin level and CBC   Lynann Beaver PharmD, BCPS Clinical Pharmacist [facility] main pharmacy [phone] 10/09/2020 3:13 PM

## 2020-10-09 NOTE — Plan of Care (Signed)

## 2020-10-09 NOTE — ED Provider Notes (Signed)
[facility]-EMERGENCY DEPT Provider Note   CSN: [id number] Arrival date & time: 10/09/20  1135     History Chief Complaint  Patient presents with  . Abdominal Pain    Dalton Parrish is a 70 y.o. male reports himself as otherwise healthy no daily medication use.  Patient reports 2 days ago he developed right-sided flank pain it was a constant sharp pain radiating down to his lower abdomen no clear inciting event.  He denies any known aggravating or alleviating factors.  He reports pain is gradually worsened he now rates his pain as severe.  Patient denies similar pain in the past.  Denies fever/chills, chest pain/sob, nausea/vomiting, diarrhea, testicular pain/swelling, dysuria/hematuria, fall/injury or any additional concerns.  HPI     History reviewed. No pertinent past medical history.  There are no problems to display for this patient.   History reviewed. No pertinent surgical history.     No family history on file.  Social History   Substance Use Topics  . Alcohol use: Not Currently  . Drug use: Not Currently    Home Medications Prior to Admission medications   Medication Sig Start Date End Date Taking? Authorizing Provider  methocarbamol (ROBAXIN) 500 MG tablet Take 2 tablets (1,000 mg total) by mouth 4 (four) times daily. 01/28/18   Renne CriglerGeiple, Joshua, PA-C  predniSONE (DELTASONE) 20 MG tablet 3 Tabs PO Days 1-3, then 2 tabs PO Days 4-6, then 1 tab PO Day 7-9, then Half Tab PO Day 10-12 01/28/18   Renne CriglerGeiple, Joshua, PA-C  traMADol (ULTRAM) 50 MG tablet Take 1 tablet (50 mg total) by mouth every 6 (six) hours as needed. 01/28/18   Renne CriglerGeiple, Joshua, PA-C    Allergies    Patient has no known allergies.  Review of Systems   Review of Systems Ten systems are reviewed and are negative for acute change except as noted in the HPI  Physical Exam Updated Vital Signs BP (!) 158/101   Pulse (!) 104   Temp 98.3 F (36.8 C) (Oral)   Resp (!) 39   Ht 6' (1.829  m)   Wt 95.3 kg   SpO2 94%   BMI 28.48 kg/m   Physical Exam Constitutional:      General: He is not in acute distress.    Appearance: Normal appearance. He is well-developed. He is not ill-appearing or diaphoretic.  HENT:     Head: Normocephalic and atraumatic.  Eyes:     General: Vision grossly intact. Gaze aligned appropriately.     Pupils: Pupils are equal, round, and reactive to light.  Neck:     Trachea: Trachea and phonation normal.  Pulmonary:     Effort: Pulmonary effort is normal. No respiratory distress.  Abdominal:     General: There is no distension.     Palpations: Abdomen is soft.     Tenderness: There is generalized abdominal tenderness. There is no guarding or rebound.  Musculoskeletal:        General: Normal range of motion.     Cervical back: Normal range of motion.  Skin:    General: Skin is warm and dry.  Neurological:     Mental Status: He is alert.     GCS: GCS eye subscore is 4. GCS verbal subscore is 5. GCS motor subscore is 6.     Comments: Speech is clear and goal oriented, follows commands Major Cranial nerves without deficit, no facial droop Moves extremities without ataxia, coordination intact  Psychiatric:  Behavior: Behavior normal.     ED Results / Procedures / Treatments   Labs (all labs ordered are listed, but only abnormal results are displayed) Labs Reviewed  COMPREHENSIVE METABOLIC PANEL - Abnormal; Notable for the following components:      Result Value   Glucose, Bld 150 (*)    Total Protein 9.0 (*)    Total Bilirubin 1.8 (*)    All other components within normal limits  CBC - Abnormal; Notable for the following components:   WBC 10.7 (*)    All other components within normal limits  URINALYSIS, ROUTINE W REFLEX MICROSCOPIC - Abnormal; Notable for the following components:   APPearance HAZY (*)    Hgb urine dipstick SMALL (*)    Protein, ur 30 (*)    Nitrite POSITIVE (*)    Leukocytes,Ua TRACE (*)    Bacteria, UA  MANY (*)    All other components within normal limits  CULTURE, BLOOD (ROUTINE X 2)  CULTURE, BLOOD (ROUTINE X 2)  URINE CULTURE  RESP PANEL BY RT-PCR (FLU A&B, COVID) ARPGX2  LIPASE, BLOOD  LACTIC ACID, PLASMA  LACTIC ACID, PLASMA  APTT  PROTIME-INR  TROPONIN I (HIGH SENSITIVITY)    EKG None  Radiology CT ABDOMEN PELVIS W CONTRAST  Result Date: 10/09/2020 CLINICAL DATA:  70 year old male with abdominal infection suspected EXAM: CT ABDOMEN AND PELVIS WITH CONTRAST TECHNIQUE: Multidetector CT imaging of the abdomen and pelvis was performed using the standard protocol following bolus administration of intravenous contrast. CONTRAST:  OMNIPAQUE IOHEXOL 300 MG/ML  SOLN COMPARISON:  None. FINDINGS: Lower chest: Peripheral nodular opacities at the right costophrenic sulcus, within the right lower lobe. Partial volume loss of the right lower lobe and the right middle lobe. Questionable hypodensity linear filling defects within the pulmonary arteries of the right lower lobe. These are better appreciated on the coronal images. Hepatobiliary: Subcentimeter rounded hypodensity of the right liver dome, most likely a benign biliary cyst though nonspecific. Gallbladder is decompressed. No inflammatory changes at the gallbladder fossa. Pancreas: Unremarkable pancreas Spleen: Unremarkable spleen Adrenals/Urinary Tract: - Right adrenal gland:  Unremarkable - Left adrenal gland: Unremarkable. - Right kidney: No hydronephrosis, nephrolithiasis, inflammation, or ureteral dilation. Subcentimeter hypodense lesion at the anterior cortex of the right kidney, most likely a benign cyst though strictly too small to characterize. - Left Kidney: No hydronephrosis, nephrolithiasis, inflammation, or ureteral dilation. Subcentimeter hypodense lesion at the posterior cortex of the left kidney at the hilum, most likely benign cyst though strictly too small to characterize. - Urinary Bladder: Unremarkable. Stomach/Bowel: -  Stomach: Stomach demonstrates some gaseous distension though otherwise unremarkable. Hiatal hernia. No evidence of gastric outlet obstruction. - Small bowel: Gas filled small bowel loops distally without transition point or wall thickening. No focal inflammatory changes or interloop fluid. - Appendix: Normal. - Colon: Formed stool within right colon sigmoid colon and rectum. Mild gaseous distension of the transverse colon. No focal wall thickening or focal inflammatory changes. Vascular/Lymphatic: Atherosclerotic changes of the abdominal aorta and the iliac arteries. Bilateral iliac arteries and proximal femoral arteries are patent. Reproductive: Unremarkable prostate Other: Bilateral fat containing inguinal hernia. Fat containing umbilical hernia without inflammatory changes or evidence of obstruction. Musculoskeletal: Degenerative changes of the visualized thoracolumbar spine. No acute displaced fracture. Vacuum disc phenomenon at L2-L3, L4-L5, L5-S1. IMPRESSION: CT negative for acute intra-abdominal process. Incidentally imaged filling defects of the right lower lobe and right middle lobe, which are favored to represent pulmonary emboli and associated right middle lobe/lower lobe  consolidation. Further evaluation with a dedicated CTA chest PE protocol is indicated for confirmation. These results were discussed by telephone at the time of interpretation on 10/09/2020 at 2:56 pm with [facility],. Borderline dilated small bowel and colon, without evidence of obstruction. This is nonspecific, potentially representing enteritis, slow transit of bowel contents, in/or reflective of constipation/obstipation. Electronically Signed   By: Gilmer Mor D.O.   On: 10/09/2020 15:05    Procedures .Critical Care Performed by: Bill Salinas, PA-C Authorized by: Bill Salinas, PA-C   Critical care provider statement:    Critical care time (minutes):  40   Critical care was time spent personally by me on  the following activities:  Discussions with consultants, evaluation of patient's response to treatment, examination of patient, ordering and performing treatments and interventions, ordering and review of laboratory studies, ordering and review of radiographic studies, pulse oximetry, re-evaluation of patient's condition, obtaining history from patient or surrogate, review of old charts and development of treatment plan with patient or surrogate     Medications Ordered in ED Medications  cefTRIAXone (ROCEPHIN) 1 g in sodium chloride 0.9 % 100 mL IVPB (1 g Intravenous New Bag/Given 10/09/20 1531)  fentaNYL (SUBLIMAZE) injection 50 mcg (has no administration in time range)  heparin bolus via infusion 2,700 Units (has no administration in time range)  heparin ADULT infusion 100 units/mL (25000 units/262mL) (has no administration in time range)  fentaNYL (SUBLIMAZE) injection 50 mcg (50 mcg Intravenous Given 10/09/20 1246)  sodium chloride 0.9 % bolus 1,000 mL (0 mLs Intravenous Stopped 10/09/20 1435)  morphine 4 MG/ML injection 4 mg (4 mg Intravenous Given 10/09/20 1425)  sodium chloride 0.9 % bolus 500 mL (500 mLs Intravenous New Bag/Given 10/09/20 1531)  iohexol (OMNIPAQUE) 300 MG/ML solution 100 mL (100 mLs Intravenous Contrast Given 10/09/20 1432)  iohexol (OMNIPAQUE) 350 MG/ML injection 75 mL (75 mLs Intravenous Contrast Given 10/09/20 1531)    ED Course  I have reviewed the triage vital signs and the nursing notes.  Pertinent labs & imaging results that were available during my care of the patient were reviewed by me and considered in my medical decision making (see chart for details).    MDM Rules/Calculators/A&P                         Additional history obtained from: 1. Nursing notes from this visit. 2. Electronic medical records. ----------------- 70 year old male no pertinent prior medical history presented for right flank pain onset 2 days ago rating across his abdomen.  Denies  similar in the past, denies infectious symptoms, no other concerns.  He is uncomfortable with a tender abdomen on exam.  Slightly tachycardic possibly secondary from pain will give pain medication and obtain CT abdomen pelvis for further evaluation.  Will obtain EKG given tachycardia. - CBC shows leukocytosis of 10.7, no anemia or thrombocytopenia. Urinalysis shows evidence of UTI with leukocytes, nitrites and many bacteria. Lipase within normal limits, doubt pancreatitis. CMP shows no emergent electrolyte derangement, AKI, LFT elevations or gap.  Patient was reassessed reports minimal pain improvement after fentanyl.  Additional pain medication ordered.  Given patient's tachycardia, mild leukocytosis and UTI will add on lactic, blood cultures and urine culture for concern of possible pyelonephritis. ----------- CTAP:  IMPRESSION:  CT negative for acute intra-abdominal process.    Incidentally imaged filling defects of the right lower lobe and  right middle lobe, which are favored to represent pulmonary emboli  and associated  right middle lobe/lower lobe consolidation. Further  evaluation with a dedicated CTA chest PE protocol is indicated for  confirmation. These results were discussed by telephone at the time  of interpretation on 10/09/2020 at 2:56 pm with [facility],.    Borderline dilated small bowel and colon, without evidence of  obstruction. This is nonspecific, potentially representing  enteritis, slow transit of bowel contents, in/or reflective of  constipation/obstipation.   Discussed case with attending physician Dr. Estell Harpin, plan of care is to start patient on heparin and obtain CT angio chest PE study for further evaluation.  Have also added troponin and additional pain medication.  Patient reassessed he is sitting with stool on the side of the room, mildly tachycardic otherwise vital signs are stable.  SPO2 around 94% on room air, I have asked nursing staff to place  patient on supplemental o2 for comfort..  I updated patient on findings today and he states understanding and is agreeable to further work-up.  I also spoke with radiologist Dr. Loreta Ave regarding obtaining CT angio PE study he agrees with a second contrasted study today.  Care handoff given to Graciella Freer PA-C at shift change. Plan of care is to follow-up on CTA Chest PE Study and then admit.    Note: Portions of this report may have been transcribed using voice recognition software. Every effort was made to ensure accuracy; however, inadvertent computerized transcription errors may still be present. Final Clinical Impression(s) / ED Diagnoses Final diagnoses:  None    Rx / DC Orders ED Discharge Orders    None       Elizabeth Palau 10/09/20 1543    Bethann Berkshire, MD 10/10/20 [phone]

## 2020-10-09 NOTE — ED Triage Notes (Signed)
Per EMS- patient c/o abdominal pain patient denies any N/v/D.

## 2020-10-09 NOTE — ED Notes (Signed)
Abnormal lab, Lactic 2.4 physician notified

## 2020-10-10 ENCOUNTER — Inpatient Hospital Stay (HOSPITAL_COMMUNITY): Payer: Medicare Other

## 2020-10-10 ENCOUNTER — Encounter (HOSPITAL_COMMUNITY): Payer: Self-pay

## 2020-10-10 DIAGNOSIS — I2699 Other pulmonary embolism without acute cor pulmonale: Secondary | ICD-10-CM

## 2020-10-10 DIAGNOSIS — I10 Essential (primary) hypertension: Secondary | ICD-10-CM

## 2020-10-10 DIAGNOSIS — R52 Pain, unspecified: Secondary | ICD-10-CM

## 2020-10-10 LAB — LIPID PANEL
Cholesterol: 153 mg/dL (ref 0–200)
HDL: 40 mg/dL — ABNORMAL LOW (ref >40)
LDL Cholesterol: 93 mg/dL (ref 0–99)
Total CHOL/HDL Ratio: 3.8 RATIO
Triglycerides: 102 mg/dL (ref <150)
VLDL: 20 mg/dL (ref 0–40)

## 2020-10-10 LAB — BASIC METABOLIC PANEL
Anion gap: 12 (ref 5–15)
BUN: 17 mg/dL (ref 8–23)
CO2: 22 mmol/L (ref 22–32)
Calcium: 9.2 mg/dL (ref 8.9–10.3)
Chloride: 103 mmol/L (ref 98–111)
Creatinine, Ser: 1.09 mg/dL (ref 0.61–1.24)
GFR, Estimated: >60 mL/min (ref >60)
Glucose, Bld: 178 mg/dL — ABNORMAL HIGH (ref 70–99)
Potassium: 3.8 mmol/L (ref 3.5–5.1)
Sodium: 137 mmol/L (ref 135–145)

## 2020-10-10 LAB — HIV ANTIBODY (ROUTINE TESTING W REFLEX): HIV Screen 4th Generation wRfx: NONREACTIVE

## 2020-10-10 LAB — CBC
HCT: 35.7 % — ABNORMAL LOW (ref 39.0–52.0)
Hemoglobin: 11.9 g/dL — ABNORMAL LOW (ref 13.0–17.0)
MCH: 28.4 pg (ref 26.0–34.0)
MCHC: 33.3 g/dL (ref 30.0–36.0)
MCV: 85.2 fL (ref 80.0–100.0)
Platelets: 171 10*3/uL (ref 150–400)
RBC: 4.19 MIL/uL — ABNORMAL LOW (ref 4.22–5.81)
RDW: 14.2 % (ref 11.5–15.5)
WBC: 10.6 10*3/uL — ABNORMAL HIGH (ref 4.0–10.5)
nRBC: 0 % (ref 0.0–0.2)

## 2020-10-10 LAB — HEMOGLOBIN A1C
Hgb A1c MFr Bld: 4.9 % (ref 4.8–5.6)
Mean Plasma Glucose: 93.93 mg/dL

## 2020-10-10 LAB — ECHOCARDIOGRAM COMPLETE
Area-P 1/2: 4.89 cm2
Height: 72 in
S' Lateral: 2.8 cm
Weight: 3360 oz

## 2020-10-10 LAB — HEPARIN LEVEL (UNFRACTIONATED)
Heparin Unfractionated: <0.1 IU/mL — ABNORMAL LOW (ref 0.30–0.70)
Heparin Unfractionated: 0.57 IU/mL (ref 0.30–0.70)
Heparin Unfractionated: 0.76 IU/mL — ABNORMAL HIGH (ref 0.30–0.70)

## 2020-10-10 LAB — CULTURE, BLOOD (ROUTINE X 2): Special Requests: ADEQUATE

## 2020-10-10 MED ORDER — HEPARIN (PORCINE) 25000 UT/250ML-% IV SOLN
2100.0000 [IU]/h | INTRAVENOUS | Status: DC
  Administered 2020-10-10: 2250 [IU]/h via INTRAVENOUS
  Administered 2020-10-11: 2100 [IU]/h via INTRAVENOUS
  Filled 2020-10-10 (×3): qty 250

## 2020-10-10 MED ORDER — HEPARIN BOLUS VIA INFUSION
3000.0000 [IU] | Freq: Once | INTRAVENOUS | Status: AC
  Administered 2020-10-10: 3000 [IU] via INTRAVENOUS
  Filled 2020-10-10: qty 3000

## 2020-10-10 MED ORDER — IPRATROPIUM-ALBUTEROL 0.5-2.5 (3) MG/3ML IN SOLN
3.0000 mL | Freq: Three times a day (TID) | RESPIRATORY_TRACT | Status: DC
  Administered 2020-10-11 – 2020-10-12 (×4): 3 mL via RESPIRATORY_TRACT
  Filled 2020-10-10 (×4): qty 3

## 2020-10-10 MED ORDER — HEPARIN BOLUS VIA INFUSION
2000.0000 [IU] | INTRAVENOUS | Status: AC
  Administered 2020-10-10: 2000 [IU] via INTRAVENOUS
  Filled 2020-10-10: qty 2000

## 2020-10-10 MED ORDER — ALBUTEROL SULFATE (2.5 MG/3ML) 0.083% IN NEBU: 2.5000 mg | INHALATION_SOLUTION | RESPIRATORY_TRACT | Status: DC | PRN

## 2020-10-10 MED ORDER — BENZONATATE 100 MG PO CAPS
200.0000 mg | ORAL_CAPSULE | Freq: Three times a day (TID) | ORAL | Status: DC
  Administered 2020-10-10 – 2020-10-12 (×7): 200 mg via ORAL
  Filled 2020-10-10 (×7): qty 2

## 2020-10-10 NOTE — Progress Notes (Signed)
  Echocardiogram 2D Echocardiogram has been performed.  Dalton Parrish 10/10/2020, 2:12 PM

## 2020-10-10 NOTE — CV Procedure (Signed)
BLE Venous duplex completed.  Results can be found under chart review under CV PROC. 10/10/2020 11:02 AM Danai Gotto RVT, RDMS

## 2020-10-10 NOTE — TOC Benefit Eligibility Note (Signed)
Transition of Care [facility]) Benefit Eligibility Note    Patient Details  Name: Dalton Parrish MRN: [id number] Date of Birth: 30-Nov-1950   Medication/Dose: Eliquis 5mg  2x day for 30days and Xarelto 20mg  daily for 30days  Covered?: No     Prescription Coverage [facility]erred Pharmacy: [facility] on with Person/Company/Phone Number:: Spoke with [name]  Co-Pay: unattainable no Rx coverage        Additional Notes: [name] has no Rx policy or other insurance coverage    Beverely Pace Phone Number: 10/10/2020, 2:46 PM

## 2020-10-10 NOTE — Progress Notes (Signed)
ANTICOAGULATION CONSULT NOTE  Pharmacy Consult for Heparin Indication: pulmonary embolus  No Known Allergies  Patient Measurements: Height: 6' (182.9 cm) Weight: 95.3 kg (210 lb) IBW/kg (Calculated) : 77.6 Heparin Dosing Weight: TBW  Vital Signs: Temp: 97.4 F (36.3 C) (03/15 1456) Temp Source: Oral (03/15 1456) BP: 136/82 (03/15 1456) Pulse Rate: 86 (03/15 1456)  Labs: Recent Labs    10/09/20 1235 10/09/20 1530 10/09/20 1800 10/09/20 2303 10/10/20 0752 10/10/20 1658  HGB 13.5  --   --   --  11.9*  --   HCT 40.0  --   --   --  35.7*  --   PLT 223  --   --   --  171  --   APTT  --  33  --   --   --   --   LABPROT  --  14.8  --   --   --   --   INR  --  1.2  --   --   --   --   HEPARINUNFRC  --   --   --  <0.10* <0.10* 0.57  CREATININE 1.16  --   --   --  1.09  --   TROPONINIHS 4  --  6  --   --   --     Estimated Creatinine Clearance: 78.8 mL/min (by C-G formula based on SCr of 1.09 mg/dL).  Assessment: 75 yoM admitted on 3/14 with abdominal pain and CT abdomen incidentally shows RLL and RML filling defects which may represent PE.  CTa + R-sided PE without evidence of R heart strain.  Baseline CBC & coags WNL.  No known use of prior to admission anticoagulation.    10/10/2020:  PM heparin level = 0.57 units/mL, now therapeutic   No bleeding or infusion related issues per RN  CBC: Hgb 13.5 > 11.9, Pltc WNL  Goal of Therapy:  Heparin level 0.3-0.7 units/ml Monitor platelets by anticoagulation protocol: Yes   Plan:  Continue IV heparin infusion at 2250 units/hr Heparin level in 6 hours to confirm remains within therapeutic range Daily heparin level and CBC while on heparin   Greer Pickerel, PharmD, BCPS Clinical Pharmacist 10/10/2020 6:11 PM

## 2020-10-10 NOTE — Plan of Care (Signed)
Patient reports pain and dyspnea on exertion much improved from admission.  Maintains oxygen saturation around 90% on room air, utilizing flutter valve.

## 2020-10-10 NOTE — Progress Notes (Addendum)
[facility]                                                                              Patient Demographics  Dalton Parrish, is a 70 y.o. male, DOB - 1950-11-23, ZOX:[id number]  Admit date - 10/09/2020   Admitting Physician Ripudeep Jenna LuoK Rai, MD  Outpatient Primary MD for the patient is Patient, No Pcp Per (patient has no PCP)  Outpatient specialists:   LOS - 1  days   Medical records reviewed and are as summarized below:    Chief Complaint  Patient presents with  . Abdominal Pain       Brief summary   Patient is a 70 year old male with prior history of hypertension, has not been going to primary care, not on any meds stated that he has chronic right-sided flank pain however it got worse 2 days ago.  States no nausea vomiting or diarrhea but noticed that the pain was worse with coughing or deep breathing.  He described the pain as constant sharp pain radiating down to his lower abdomen, pain has been gradually worsening, currently 8/10.  No dysuria or hematuria.  No fevers. Denies any personal history of clots, DVT or PE.  No long distance car travel or flights.  States he is otherwise functionally active.  Denies any family history of clots. States he did not get Covid vaccine and does not know if he ever had Covid  CT chest showed marked severity right-sided pulmonary embolism without evidence of saddle embolus or right heart strain.  Mild to moderate severity bilateral atelectasis and/or infiltrate UA positive for UTI  Patient was admitted for acute PE, COPD exacerbation and UTI  Assessment & Plan     Principal Problem:   Acute respiratory failure (HCC) secondary to right-sided pulmonary embolism -CTA chest showed marked severity right-sided pulmonary embolism without evidence of saddle embolus or right heart strain -No provoking risk factors for PE -Continue IV heparin drip, currently O2 sats 95% on 3 L, wean O2 as tolerated -TOC consult placed, patient  has no PCP. [facility], will transition to oral DOAC with insurance co-pay -Follow 2D echo, lower extremity venous Dopplers -Overall right-sided flank pain, lower chest pleuritic pain is improving today Addendum 12:40PM Doppler ultrasound of the lower extremities negative for DVT  Active Problems:    Acute lower UTI -Follow urine culture and sensitivities, continue IV Rocephin   Nicotine abuse, COPD exacerbation -Wheezing is improving today, has smoking history -Continue IV Solu-Medrol, scheduled duo nebs.   -  Flutter valve, on IV Rocephin  -Placed on Tessalon Perles, Robitussin, having coughing episode     Accelerated hypertension -Patient had a history of hypertension but not taking any meds -BP now stable, placed on losartan, metoprolol   Hyperglycemia -CBG 150, no prior history of diabetes -Hemoglobin A1c 4.9 -Lipid panel showed LDL 93, cholesterol 153   Code Status: Full CODE STATUS DVT Prophylaxis:  heparin bolus via infusion 3,000 Units Start: 10/10/20 1115   Level of Care: Level of care: Telemetry Family Communication: Discussed all imaging results, lab results, explained to the patient  Disposition Plan:     Status is: Inpatient  Remains inpatient appropriate because:Inpatient level of care appropriate due to severity of illness   Dispo: The patient is from: Home              Anticipated d/c is to: Home              Patient currently is not medically stable to d/c.  Hopefully DC home in a.m. once patient is transition to oral DOAC   Difficult to place patient No      Time Spent in minutes 35 minutes  Procedures:  CT angiogram chest  Consultants:   none  Antimicrobials:   Anti-infectives (From admission, onward)   Start     Dose/Rate Route Frequency Ordered Stop   10/10/20 1600  cefTRIAXone (ROCEPHIN) 1 g in sodium chloride 0.9 % 100 mL IVPB        1 g 200 mL/hr over 30 Minutes Intravenous Every  24 hours 10/09/20 1938     10/09/20 1430  cefTRIAXone (ROCEPHIN) 1 g in sodium chloride 0.9 % 100 mL IVPB        1 g 200 mL/hr over 30 Minutes Intravenous  Once 10/09/20 1426 10/09/20 1600          Medications  Scheduled Meds: . chlorhexidine  15 mL Mouth Rinse BID  . heparin  3,000 Units Intravenous Once  . ipratropium-albuterol  3 mL Nebulization Q6H  . losartan  50 mg Oral Daily  . mouth rinse  15 mL Mouth Rinse q12n4p  . methylPREDNISolone (SOLU-MEDROL) injection  60 mg Intravenous Q6H   Followed by  . [START ON 10/11/2020] predniSONE  40 mg Oral Q breakfast  . metoprolol tartrate  25 mg Oral BID   Continuous Infusions: . cefTRIAXone (ROCEPHIN)  IV    . heparin     PRN Meds:.acetaminophen **OR** acetaminophen, HYDROcodone-acetaminophen, morphine injection, polyethylene glycol      Subjective:   Jerime Arif was seen and examined today.  States right flank pain, right-sided pleuritic chest pain improving from yesterday.  Had a coughing episode, dry.  Patient denies dizziness,  abdominal pain, N/V/D/C, new weakness, numbess, tingling. No acute events overnight.  No fevers. Objective:   Vitals:   10/10/20 0558 10/10/20 0738 10/10/20 0913 10/10/20 1034  BP: 122/79   134/86  Pulse: 81   84  Resp: 20   15  Temp: 98.2 F (36.8 C)   97.6 F (36.4 C)  TempSrc: Oral   Oral  SpO2: 94% 95% 95% 96%  Weight:      Height:        Intake/Output Summary (Last 24 hours) at 10/10/2020 1053 Last data filed at 10/10/2020 0600 Gross per 24 hour  Intake 1830.3 ml  Output 800 ml  Net 1030.3 ml     Wt Readings from Last 3 Encounters:  10/09/20 95.3 kg     Exam  General: Alert and oriented x 3, NAD  Cardiovascular: S1 S2 auscultated, no murmurs, RRR  Respiratory: Diminished breath sounds bilaterally with mild scattered wheezing  Gastrointestinal: Soft, nontender, nondistended, + bowel sounds  Ext: no pedal edema bilaterally  Neuro: no new  deficits  Musculoskeletal: No digital cyanosis, clubbing  Skin: No rashes  Psych: Normal affect and demeanor, alert and oriented x3    Data Reviewed:  I have personally reviewed following labs and imaging studies  Micro Results Recent Results (from the past 240 hour(s))  Resp Panel by RT-PCR (Flu A&B, Covid) Nasopharyngeal Swab  Status: None   Collection Time: 10/09/20  3:32 PM   Specimen: Nasopharyngeal Swab; Nasopharyngeal(NP) swabs in vial transport medium  Result Value Ref Range Status   SARS Coronavirus 2 by RT PCR NEGATIVE NEGATIVE Final    Comment: (NOTE) SARS-CoV-2 target nucleic acids are NOT DETECTED.  The SARS-CoV-2 RNA is generally detectable in upper respiratory specimens during the acute phase of infection. The lowest concentration of SARS-CoV-2 viral copies this assay can detect is 138 copies/mL. A negative result does not preclude SARS-Cov-2 infection and should not be used as the sole basis for treatment or other patient management decisions. A negative result may occur with  improper specimen collection/handling, submission of specimen other than nasopharyngeal swab, presence of viral mutation(s) within the areas targeted by this assay, and inadequate number of viral copies(<138 copies/mL). A negative result must be combined with clinical observations, patient history, and epidemiological information. The expected result is Negative.  Fact Sheet for Patients:  BloggerCourse.com  Fact Sheet for Healthcare Providers:  SeriousBroker.it  This test is no t yet approved or cleared by the [place] FDA and  has been authorized for detection and/or diagnosis of SARS-CoV-2 by FDA under an Emergency Use Authorization (EUA). This EUA will remain  in effect (meaning this test can be used) for the duration of the COVID-19 declaration under Section 564(b)(1) of the Act, 21 U.S.C.section 360bbb-3(b)(1), unless the  authorization is terminated  or revoked sooner.       Influenza A by PCR NEGATIVE NEGATIVE Final   Influenza B by PCR NEGATIVE NEGATIVE Final    Comment: (NOTE) The Xpert Xpress SARS-CoV-2/FLU/RSV plus assay is intended as an aid in the diagnosis of influenza from Nasopharyngeal swab specimens and should not be used as a sole basis for treatment. Nasal washings and aspirates are unacceptable for Xpert Xpress SARS-CoV-2/FLU/RSV testing.  Fact Sheet for Patients: BloggerCourse.com  Fact Sheet for Healthcare Providers: SeriousBroker.it  This test is not yet approved or cleared by the [place] FDA and has been authorized for detection and/or diagnosis of SARS-CoV-2 by FDA under an Emergency Use Authorization (EUA). This EUA will remain in effect (meaning this test can be used) for the duration of the COVID-19 declaration under Section 564(b)(1) of the Act, 21 U.S.C. section 360bbb-3(b)(1), unless the authorization is terminated or revoked.  Performed at [facility], [address], [place], [place] [address]     Radiology Reports CT Angio Chest PE W and/or Wo Contrast  Result Date: 10/09/2020 CLINICAL DATA:  Shortness of breath and wheezing. EXAM: CT ANGIOGRAPHY CHEST WITH CONTRAST TECHNIQUE: Multidetector CT imaging of the chest was performed using the standard protocol during bolus administration of intravenous contrast. Multiplanar CT image reconstructions and MIPs were obtained to evaluate the vascular anatomy. CONTRAST:  74mL OMNIPAQUE IOHEXOL 350 MG/ML SOLN COMPARISON:  None. FINDINGS: Cardiovascular: The thoracic aorta is normal in appearance. Marked severity intraluminal filling defects are seen involving the distal aspect of the right pulmonary artery. Extensive involvement of the middle lobe and lower lobe branches is seen. There is no evidence of saddle embolus. Normal heart size without evidence of  right heart strain. No pericardial effusion. Mediastinum/Nodes: No enlarged mediastinal, hilar, or axillary lymph nodes. Thyroid gland, trachea, and esophagus demonstrate no significant findings. Lungs/Pleura: There is mild emphysematous lung disease. Mild areas of atelectasis and/or infiltrate are seen within the inferior aspect of the left upper lobe and posterior aspects of the right upper lobe, right middle lobe and left lung base.  Moderate severity atelectasis and/or infiltrate is seen within the right lower lobe. There is no evidence of a pleural effusion or pneumothorax. Upper Abdomen: There is a small hiatal hernia. Musculoskeletal: A 10.5 cm x 3.2 cm subcutaneous lipoma is seen within the soft tissues of the back, to the left of midline. Degenerative changes seen throughout the thoracic spine. Review of the MIP images confirms the above findings. IMPRESSION: 1. Marked severity right-sided pulmonary embolism without evidence of saddle embolus or right heart strain. 2. Mild to moderate severity bilateral atelectasis and/or infiltrate. 3. Small hiatal hernia. 4. Emphysema. Emphysema (ICD10-J43.9). Electronically Signed   By: Aram Candela M.D.   On: 10/09/2020 16:02   CT ABDOMEN PELVIS W CONTRAST  Result Date: 10/09/2020 CLINICAL DATA:  70 year old male with abdominal infection suspected EXAM: CT ABDOMEN AND PELVIS WITH CONTRAST TECHNIQUE: Multidetector CT imaging of the abdomen and pelvis was performed using the standard protocol following bolus administration of intravenous contrast. CONTRAST:  OMNIPAQUE IOHEXOL 300 MG/ML  SOLN COMPARISON:  None. FINDINGS: Lower chest: Peripheral nodular opacities at the right costophrenic sulcus, within the right lower lobe. Partial volume loss of the right lower lobe and the right middle lobe. Questionable hypodensity linear filling defects within the pulmonary arteries of the right lower lobe. These are better appreciated on the coronal images.  Hepatobiliary: Subcentimeter rounded hypodensity of the right liver dome, most likely a benign biliary cyst though nonspecific. Gallbladder is decompressed. No inflammatory changes at the gallbladder fossa. Pancreas: Unremarkable pancreas Spleen: Unremarkable spleen Adrenals/Urinary Tract: - Right adrenal gland:  Unremarkable - Left adrenal gland: Unremarkable. - Right kidney: No hydronephrosis, nephrolithiasis, inflammation, or ureteral dilation. Subcentimeter hypodense lesion at the anterior cortex of the right kidney, most likely a benign cyst though strictly too small to characterize. - Left Kidney: No hydronephrosis, nephrolithiasis, inflammation, or ureteral dilation. Subcentimeter hypodense lesion at the posterior cortex of the left kidney at the hilum, most likely benign cyst though strictly too small to characterize. - Urinary Bladder: Unremarkable. Stomach/Bowel: - Stomach: Stomach demonstrates some gaseous distension though otherwise unremarkable. Hiatal hernia. No evidence of gastric outlet obstruction. - Small bowel: Gas filled small bowel loops distally without transition point or wall thickening. No focal inflammatory changes or interloop fluid. - Appendix: Normal. - Colon: Formed stool within right colon sigmoid colon and rectum. Mild gaseous distension of the transverse colon. No focal wall thickening or focal inflammatory changes. Vascular/Lymphatic: Atherosclerotic changes of the abdominal aorta and the iliac arteries. Bilateral iliac arteries and proximal femoral arteries are patent. Reproductive: Unremarkable prostate Other: Bilateral fat containing inguinal hernia. Fat containing umbilical hernia without inflammatory changes or evidence of obstruction. Musculoskeletal: Degenerative changes of the visualized thoracolumbar spine. No acute displaced fracture. Vacuum disc phenomenon at L2-L3, L4-L5, L5-S1. IMPRESSION: CT negative for acute intra-abdominal process. Incidentally imaged filling  defects of the right lower lobe and right middle lobe, which are favored to represent pulmonary emboli and associated right middle lobe/lower lobe consolidation. Further evaluation with a dedicated CTA chest PE protocol is indicated for confirmation. These results were discussed by telephone at the time of interpretation on 10/09/2020 at 2:56 pm with [facility],. Borderline dilated small bowel and colon, without evidence of obstruction. This is nonspecific, potentially representing enteritis, slow transit of bowel contents, in/or reflective of constipation/obstipation. Electronically Signed   By: Gilmer Mor D.O.   On: 10/09/2020 15:05    Lab Data:  CBC: Recent Labs  Lab 10/09/20 1235 10/10/20 0752  WBC 10.7* 10.6*  HGB  13.5 11.9*  HCT 40.0 35.7*  MCV 84.4 85.2  PLT 223 171   Basic Metabolic Panel: Recent Labs  Lab 10/09/20 1235 10/10/20 0752  NA 140 137  K 4.0 3.8  CL 105 103  CO2 22 22  GLUCOSE 150* 178*  BUN 16 17  CREATININE 1.16 1.09  CALCIUM 9.6 9.2   GFR: Estimated Creatinine Clearance: 78.8 mL/min (by C-G formula based on SCr of 1.09 mg/dL). Liver Function Tests: Recent Labs  Lab 10/09/20 1235  AST 18  ALT 16  ALKPHOS 79  BILITOT 1.8*  PROT 9.0*  ALBUMIN 4.6   Recent Labs  Lab 10/09/20 1235  LIPASE 22   No results for input(s): AMMONIA in the last 168 hours. Coagulation Profile: Recent Labs  Lab 10/09/20 1530  INR 1.2   Cardiac Enzymes: No results for input(s): CKTOTAL, CKMB, CKMBINDEX, TROPONINI in the last 168 hours. BNP (last 3 results) No results for input(s): PROBNP in the last 8760 hours. HbA1C: Recent Labs    10/09/20 2303  HGBA1C 4.9   CBG: No results for input(s): GLUCAP in the last 168 hours. Lipid Profile: Recent Labs    10/10/20 0752  CHOL 153  HDL 40*  LDLCALC 93  TRIG 478  CHOLHDL 3.8   Thyroid Function Tests: No results for input(s): TSH, T4TOTAL, FREET4, T3FREE, THYROIDAB in the last 72 hours. Anemia  Panel: No results for input(s): VITAMINB12, FOLATE, FERRITIN, TIBC, IRON, RETICCTPCT in the last 72 hours. Urine analysis:    Component Value Date/Time   COLORURINE YELLOW 10/09/2020 1311   APPEARANCEUR HAZY (A) 10/09/2020 1311   LABSPEC 1.013 10/09/2020 1311   PHURINE 5.0 10/09/2020 1311   GLUCOSEU NEGATIVE 10/09/2020 1311   HGBUR SMALL (A) 10/09/2020 1311   BILIRUBINUR NEGATIVE 10/09/2020 1311   KETONESUR NEGATIVE 10/09/2020 1311   PROTEINUR 30 (A) 10/09/2020 1311   NITRITE POSITIVE (A) 10/09/2020 1311   LEUKOCYTESUR TRACE (A) 10/09/2020 1311     Ripudeep Rai M.D. [facility] 10/10/2020, 10:53 AM  Available via Epic secure chat 7am-7pm After 7 pm, please refer to night coverage provider listed on amion.

## 2020-10-10 NOTE — Progress Notes (Signed)
ANTICOAGULATION CONSULT NOTE  Pharmacy Consult for Heparin Indication: pulmonary embolus  No Known Allergies  Patient Measurements: Height: 6' (182.9 cm) Weight: 95.3 kg (210 lb) IBW/kg (Calculated) : 77.6 Heparin Dosing Weight: TBW  Vital Signs: Temp: 98.1 F (36.7 C) (03/14 1900) Temp Source: Oral (03/14 1900) BP: 147/105 (03/14 2040) Pulse Rate: 89 (03/14 2040)  Labs: Recent Labs    10/09/20 1235 10/09/20 1530 10/09/20 1800 10/09/20 2303  HGB 13.5  --   --   --   HCT 40.0  --   --   --   PLT 223  --   --   --   APTT  --  33  --   --   LABPROT  --  14.8  --   --   INR  --  1.2  --   --   HEPARINUNFRC  --   --   --  <0.10*  CREATININE 1.16  --   --   --   TROPONINIHS 4  --  6  --     Estimated Creatinine Clearance: 74 mL/min (by C-G formula based on SCr of 1.16 mg/dL).   Medical History: History reviewed. No pertinent past medical history.  Medications:  Scheduled:  . chlorhexidine  15 mL Mouth Rinse BID  . ipratropium-albuterol  3 mL Nebulization Q6H  . losartan  50 mg Oral Daily  . mouth rinse  15 mL Mouth Rinse q12n4p  . methylPREDNISolone (SOLU-MEDROL) injection  60 mg Intravenous Q6H   Followed by  . [START ON 10/11/2020] predniSONE  40 mg Oral Q breakfast  . metoprolol tartrate  25 mg Oral BID   Infusions:  . cefTRIAXone (ROCEPHIN)  IV    . heparin 1,550 Units/hr (10/09/20 1614)    Assessment: 57 yoM admitted on 3/14 with abdominal pain and CT abdomen incidentally shows RLL and RML filling defects which may represent PE.  CTa + R-sided PE without evidence of R heart strain.  Baseline CBC & coags WNL.  No known use of prior to admission anticoagulation.    10/10/2020:  Initial heparin level undetectable <0.1 on IV heparin 1550 units/hr  No bleeding or infusion related issues per RN  Goal of Therapy:  Heparin level 0.3-0.7 units/ml Monitor platelets by anticoagulation protocol: Yes   Plan:  Re-bolus heparin 2000 units IV x 1 Increase  heparin IV infusion to 1900 units/hr Heparin level 6 hours after increase Daily heparin level and CBC while on heparin   Junita Push PharmD, BCPS Clinical Pharmacist [facility] main pharmacy [phone] 10/10/2020 12:12 AM

## 2020-10-10 NOTE — Progress Notes (Signed)
ANTICOAGULATION CONSULT NOTE  Pharmacy Consult for Heparin Indication: pulmonary embolus  No Known Allergies  Patient Measurements: Height: 6' (182.9 cm) Weight: 95.3 kg (210 lb) IBW/kg (Calculated) : 77.6 Heparin Dosing Weight: TBW  Vital Signs: Temp: 98.2 F (36.8 C) (03/15 0558) Temp Source: Oral (03/15 0558) BP: 122/79 (03/15 0558) Pulse Rate: 81 (03/15 0558)  Labs: Recent Labs    10/09/20 1235 10/09/20 1530 10/09/20 1800 10/09/20 2303 10/10/20 0752  HGB 13.5  --   --   --  11.9*  HCT 40.0  --   --   --  35.7*  PLT 223  --   --   --  171  APTT  --  33  --   --   --   LABPROT  --  14.8  --   --   --   INR  --  1.2  --   --   --   HEPARINUNFRC  --   --   --  <0.10* <0.10*  CREATININE 1.16  --   --   --  1.09  TROPONINIHS 4  --  6  --   --     Estimated Creatinine Clearance: 78.8 mL/min (by C-G formula based on SCr of 1.09 mg/dL).  Assessment: 48 yoM admitted on 3/14 with abdominal pain and CT abdomen incidentally shows RLL and RML filling defects which may represent PE.  CTa + R-sided PE without evidence of R heart strain.  Baseline CBC & coags WNL.  No known use of prior to admission anticoagulation.    10/10/2020:  Initial heparin level undetectable <0.1 after 2700 unit bolus and  IV heparin at 1550 units/hr  2nd heparin level remains undetectable < 0.1 after 2000 unit bolus and rate increase to 1900 units/hr  No bleeding or infusion related issues per RN  Hg 13.5> 11.9, PLC 223> 171  Goal of Therapy:  Heparin level 0.3-0.7 units/ml Monitor platelets by anticoagulation protocol: Yes   Plan:  Re-bolus heparin 3000 units IV x 1 Increase heparin IV infusion to 2250 units/hr Heparin level 6 hours after increase Daily heparin level and CBC while on heparin  Herby Abraham, Pharm.D 10/10/2020 10:15 AM Clinical Pharmacist [facility] main pharmacy [phone] 10/10/2020 10:11 AM

## 2020-10-10 NOTE — Plan of Care (Signed)

## 2020-10-11 DIAGNOSIS — R030 Elevated blood-pressure reading, without diagnosis of hypertension: Secondary | ICD-10-CM

## 2020-10-11 DIAGNOSIS — I10 Essential (primary) hypertension: Secondary | ICD-10-CM

## 2020-10-11 DIAGNOSIS — J441 Chronic obstructive pulmonary disease with (acute) exacerbation: Secondary | ICD-10-CM

## 2020-10-11 DIAGNOSIS — I2699 Other pulmonary embolism without acute cor pulmonale: Principal | ICD-10-CM

## 2020-10-11 DIAGNOSIS — N390 Urinary tract infection, site not specified: Secondary | ICD-10-CM

## 2020-10-11 DIAGNOSIS — J9601 Acute respiratory failure with hypoxia: Secondary | ICD-10-CM

## 2020-10-11 DIAGNOSIS — F17200 Nicotine dependence, unspecified, uncomplicated: Secondary | ICD-10-CM

## 2020-10-11 LAB — CBC
HCT: 34.1 % — ABNORMAL LOW (ref 39.0–52.0)
Hemoglobin: 11.5 g/dL — ABNORMAL LOW (ref 13.0–17.0)
MCH: 28.5 pg (ref 26.0–34.0)
MCHC: 33.7 g/dL (ref 30.0–36.0)
MCV: 84.4 fL (ref 80.0–100.0)
Platelets: 177 10*3/uL (ref 150–400)
RBC: 4.04 MIL/uL — ABNORMAL LOW (ref 4.22–5.81)
RDW: 14.2 % (ref 11.5–15.5)
WBC: 17 10*3/uL — ABNORMAL HIGH (ref 4.0–10.5)
nRBC: 0 % (ref 0.0–0.2)

## 2020-10-11 LAB — BASIC METABOLIC PANEL
Anion gap: 9 (ref 5–15)
BUN: 23 mg/dL (ref 8–23)
CO2: 25 mmol/L (ref 22–32)
Calcium: 9.5 mg/dL (ref 8.9–10.3)
Chloride: 106 mmol/L (ref 98–111)
Creatinine, Ser: 1.03 mg/dL (ref 0.61–1.24)
GFR, Estimated: >60 mL/min (ref >60)
Glucose, Bld: 128 mg/dL — ABNORMAL HIGH (ref 70–99)
Potassium: 4 mmol/L (ref 3.5–5.1)
Sodium: 140 mmol/L (ref 135–145)

## 2020-10-11 LAB — CULTURE, BLOOD (ROUTINE X 2)

## 2020-10-11 LAB — HEPARIN LEVEL (UNFRACTIONATED): Heparin Unfractionated: 0.85 IU/mL — ABNORMAL HIGH (ref 0.30–0.70)

## 2020-10-11 MED ORDER — APIXABAN (ELIQUIS) EDUCATION KIT FOR DVT/PE PATIENTS
PACK | Freq: Once | Status: DC
  Filled 2020-10-11: qty 1

## 2020-10-11 MED ORDER — APIXABAN 5 MG PO TABS: 5.0000 mg | ORAL_TABLET | Freq: Two times a day (BID) | ORAL | Status: DC

## 2020-10-11 MED ORDER — APIXABAN 5 MG PO TABS
10.0000 mg | ORAL_TABLET | Freq: Two times a day (BID) | ORAL | Status: DC
  Administered 2020-10-11 – 2020-10-12 (×3): 10 mg via ORAL
  Filled 2020-10-11 (×3): qty 2

## 2020-10-11 MED ORDER — HEPARIN (PORCINE) 25000 UT/250ML-% IV SOLN
1900.0000 [IU]/h | INTRAVENOUS | Status: DC
  Filled 2020-10-11: qty 250

## 2020-10-11 NOTE — Progress Notes (Addendum)
ANTICOAGULATION CONSULT NOTE  Pharmacy Consult for Heparin>> Eliquis Indication: pulmonary embolus  No Known Allergies  Patient Measurements: Height: 6' (182.9 cm) Weight: 95.3 kg (210 lb) IBW/kg (Calculated) : 77.6 Heparin Dosing Weight: TBW  Vital Signs: Temp: 98 F (36.7 C) (03/16 0421) Temp Source: Oral (03/16 0421) BP: 116/77 (03/16 0421) Pulse Rate: 70 (03/16 0421)  Labs: Recent Labs    10/09/20 1235 10/09/20 1530 10/09/20 1800 10/09/20 2303 10/10/20 0752 10/10/20 1658 10/10/20 2248 10/11/20 0651  HGB 13.5  --   --   --  11.9*  --   --  11.5*  HCT 40.0  --   --   --  35.7*  --   --  34.1*  PLT 223  --   --   --  171  --   --  177  APTT  --  33  --   --   --   --   --   --   LABPROT  --  14.8  --   --   --   --   --   --   INR  --  1.2  --   --   --   --   --   --   HEPARINUNFRC  --   --   --    < > <0.10* 0.57 0.76* 0.85*  CREATININE 1.16  --   --   --  1.09  --   --  1.03  TROPONINIHS 4  --  6  --   --   --   --   --    < > = values in this interval not displayed.    Estimated Creatinine Clearance: 83.4 mL/min (by C-G formula based on SCr of 1.03 mg/dL).  Assessment: 48 yoM admitted on 3/14 with abdominal pain and CT abdomen incidentally shows RLL and RML filling defects which may represent PE.  CTa + R-sided PE without evidence of R heart strain.  Baseline CBC & coags WNL.  No known use of prior to admission anticoagulation.    10/11/2020:  heparin level = 0.85 remains supra-therapeutic after rate decreased from 2250 to 2100 units/mL,  No bleeding or infusion related issues per RN  CBC: Hgb 13.5 > 11.9>11.5, Pltc WNL  To transition to Eliquis today  Goal of Therapy:  Heparin level 0.3-0.7 units/ml Monitor platelets by anticoagulation protocol: Yes   Plan:  DC heparin drip & labs Eliquis 10 mg po BID x 7 days followed by Eliquis 5 mg po bid  Will give pt 30 day free card & education prior to DC.   Eliquis will be $10/month at [facility] until pt  assistance completed then will be free x 1 year @ [facility] w/ [facility] PCP  Herby Abraham, Pharm.D 10/11/2020 7:27 AM

## 2020-10-11 NOTE — Progress Notes (Signed)
ANTICOAGULATION CONSULT NOTE  Pharmacy Consult for Heparin Indication: pulmonary embolus  No Known Allergies  Patient Measurements: Height: 6' (182.9 cm) Weight: 95.3 kg (210 lb) IBW/kg (Calculated) : 77.6 Heparin Dosing Weight: TBW  Vital Signs: Temp: 98.1 F (36.7 C) (03/15 2029) Temp Source: Axillary (03/15 2029) BP: 144/80 (03/15 2029) Pulse Rate: 100 (03/15 2029)  Labs: Recent Labs    10/09/20 1235 10/09/20 1530 10/09/20 1800 10/09/20 2303 10/10/20 0752 10/10/20 1658 10/10/20 2248  HGB 13.5  --   --   --  11.9*  --   --   HCT 40.0  --   --   --  35.7*  --   --   PLT 223  --   --   --  171  --   --   APTT  --  33  --   --   --   --   --   LABPROT  --  14.8  --   --   --   --   --   INR  --  1.2  --   --   --   --   --   HEPARINUNFRC  --   --   --    < > <0.10* 0.57 0.76*  CREATININE 1.16  --   --   --  1.09  --   --   TROPONINIHS 4  --  6  --   --   --   --    < > = values in this interval not displayed.    Estimated Creatinine Clearance: 78.8 mL/min (by C-G formula based on SCr of 1.09 mg/dL).  Assessment: 27 yoM admitted on 3/14 with abdominal pain and CT abdomen incidentally shows RLL and RML filling defects which may represent PE.  CTa + R-sided PE without evidence of R heart strain.  Baseline CBC & coags WNL.  No known use of prior to admission anticoagulation.    10/11/2020:  Confirmatory heparin level = 0.76 units/mL, now supra-therapeutic   No bleeding or infusion related issues per RN  CBC: Hgb 13.5 > 11.9, Pltc WNL  Goal of Therapy:  Heparin level 0.3-0.7 units/ml Monitor platelets by anticoagulation protocol: Yes   Plan:  Decrease IV heparin infusion to 2100 units/hr Repeat Heparin level in 6 hours Daily heparin level and CBC while on heparin   Junita Push, PharmD, BCPS Clinical Pharmacist 10/11/2020 12:03 AM

## 2020-10-11 NOTE — Discharge Instructions (Signed)
Information on my medicine - ELIQUIS (apixaban)  This medication education was reviewed with me or my healthcare representative as part of my discharge preparation.   Why was Eliquis prescribed for you? Eliquis was prescribed to treat blood clots that may have been found in the veins of your legs (deep vein thrombosis) or in your lungs (pulmonary embolism) and to reduce the risk of them occurring again.  What do You need to know about Eliquis ? The starting dose is 10 mg (two 5 mg tablets) taken TWICE daily for the FIRST SEVEN (7) DAYS, then on Wednesday 10/18/2020  the dose is reduced to ONE 5 mg tablet taken TWICE daily.  Eliquis may be taken with or without food.   Try to take the dose about the same time in the morning and in the evening. If you have difficulty swallowing the tablet whole please discuss with your pharmacist how to take the medication safely.  Take Eliquis exactly as prescribed and DO NOT stop taking Eliquis without talking to the doctor who prescribed the medication.  Stopping may increase your risk of developing a new blood clot.  Refill your prescription before you run out.  After discharge, you should have regular check-up appointments with your healthcare provider that is prescribing your Eliquis.    What do you do if you miss a dose? If a dose of ELIQUIS is not taken at the scheduled time, take it as soon as possible on the same day and twice-daily administration should be resumed. The dose should not be doubled to make up for a missed dose.  Important Safety Information A possible side effect of Eliquis is bleeding. You should call your healthcare provider right away if you experience any of the following: ? Bleeding from an injury or your nose that does not stop. ? Unusual colored urine (red or dark brown) or unusual colored stools (red or black). ? Unusual bruising for unknown reasons. ? A serious fall or if you hit your head (even if there is no  bleeding).  Some medicines may interact with Eliquis and might increase your risk of bleeding or clotting while on Eliquis. To help avoid this, consult your healthcare provider or pharmacist prior to using any new prescription or non-prescription medications, including herbals, vitamins, non-steroidal anti-inflammatory drugs (NSAIDs) and supplements.  This website has more information on Eliquis (apixaban): http://[email or web address].com/eliquis/home

## 2020-10-11 NOTE — TOC Progression Note (Signed)
Transition of Care [facility]) - Progression Note    Patient Details  Name: Dalton Parrish MRN: [id number] Date of Birth: 30-Dec-1950  [facility]) CM/SW Contact  Geni Bers, RN Phone Number: 10/11/2020, 12:24 PM  Clinical Narrative:    Pt from home and will return home. Appointment made at [facility]. 1st available April 21 at 9:10 AM. Pt was made aware.    Expected Discharge Plan: Home/Self Care Barriers to Discharge: No Barriers Identified  Expected Discharge Plan and Services Expected Discharge Plan: Home/Self Care       Living arrangements for the past 2 months: Single Family Home                                       Social Determinants of Health (SDOH) Interventions    Readmission Risk Interventions No flowsheet data found.

## 2020-10-11 NOTE — Progress Notes (Signed)
PROGRESS NOTE  Amad Mau DJT:[id number] DOB: [birth year]   PCP: Patient, No Pcp Per  Patient is from: Home  DOA: 10/09/2020 LOS: 2  Chief complaints: Abdominal pain  Brief Narrative / Interim history: 70 year old M with PMH of HTN, COPD and tobacco use disorder presented to ED with right flank pain, and noted to have marked severity right-sided PE, hypertensive urgency and urinalysis concerning for UTI.  He was a started on IV heparin.  Patient has no provoking factors but has no PCP and hasn't had age-appropriate cancer screening.  TTE without significant finding.  Urine culture with E. coli.  Subjective: Seen and examined earlier this morning.  No major events overnight or this morning.  Denies chest pain, dyspnea, GI or UTI symptoms.   Objective: Vitals:   10/10/20 2029 10/11/20 0421 10/11/20 0820 10/11/20 1300  BP: (!) 144/80 116/77  118/77  Pulse: 100 70    Resp: 20 20  20   Temp: 98.1 F (36.7 C) 98 F (36.7 C)  98 F (36.7 C)  TempSrc: Axillary Oral  Axillary  SpO2: 90% 96% (!) 88% 93%  Weight:      Height:        Intake/Output Summary (Last 24 hours) at 10/11/2020 1656 Last data filed at 10/11/2020 1100 Gross per 24 hour  Intake 451.62 ml  Output 1750 ml  Net -1298.38 ml   Filed Weights   10/09/20 1142  Weight: 95.3 kg    Examination:  GENERAL: No apparent distress.  Nontoxic. HEENT: MMM.  Vision and hearing grossly intact.  NECK: Supple.  No apparent JVD.  RESP:  No IWOB.  Fair aeration bilaterally. CVS:  RRR. Heart sounds normal.  ABD/GI/GU: BS+. Abd soft, NTND.  MSK/EXT:  Moves extremities. No apparent deformity. No edema.  SKIN: no apparent skin lesion or wound NEURO: Awake, alert and oriented appropriately.  No apparent focal neuro deficit. PSYCH: Calm. Normal affect.  Procedures:  None  Microbiology summarized: COVID-19 and influenza PCR nonreactive. Blood cultures NGTD. Urine culture with E. coli.  Assessment & Plan: Acute respiratory  failure due to right-sided PE-likely unprovoked.  Patient has no PCP.  No age-appropriate cancer screening.  TTE without significant finding.  LE ultrasound negative for DVT.  Respiratory failure resolved. -Transition to p.o. Eliquis -Ambulatory saturation assessment -Patient needs age-appropriate cancer screenings such as colonoscopy -TOC consulted for PCP  Acute E. coli UTI: Urine culture was E. coli.  Sensitivity pending. -Continue IV ceftriaxone pending sensitivity  COPD exacerbation?  Seems to have resolved. -Continue prednisone and as needed nebs. -Encourage tobacco cessation  Tobacco use disorder: Reportedly quit smoking about a month ago. -Congratulated.  Accelerated hypertension: Not on meds at home. -Placed on losartan, metoprolol  here.  Hyperglycemia without diagnosis of diabetes: A1c 4.9%.  Likely due to steroid.  Overweight Body mass index is 28.48 kg/m.  -Encourage lifestyle change to lose weight.       DVT prophylaxis:   apixaban (ELIQUIS) tablet 10 mg  apixaban (ELIQUIS) tablet 5 mg  Code Status: Full code Family Communication: Patient and/or RN. Available if any question.  Level of care: Telemetry Status is: Inpatient  Remains inpatient appropriate because:Ongoing diagnostic testing needed not appropriate for outpatient work up, IV treatments appropriate due to intensity of illness or inability to take PO and Inpatient level of care appropriate due to severity of illness   Dispo: The patient is from: Home              Anticipated d/c is to:  Home              Patient currently is not medically stable to d/c.   Difficult to place patient No       Consultants:  None    Sch Meds:  Scheduled Meds: . apixaban  10 mg Oral BID   Followed by  . [START ON 10/18/2020] apixaban  5 mg Oral BID  . benzonatate  200 mg Oral TID  . chlorhexidine  15 mL Mouth Rinse BID  . ipratropium-albuterol  3 mL Nebulization TID  . losartan  50 mg Oral Daily  .  mouth rinse  15 mL Mouth Rinse q12n4p  . metoprolol tartrate  25 mg Oral BID  . predniSONE  40 mg Oral Q breakfast   Continuous Infusions: . cefTRIAXone (ROCEPHIN)  IV 1 g (10/11/20 1651)   PRN Meds:.acetaminophen **OR** acetaminophen, albuterol, HYDROcodone-acetaminophen, morphine injection, polyethylene glycol  Antimicrobials: Anti-infectives (From admission, onward)   Start     Dose/Rate Route Frequency Ordered Stop   10/10/20 1600  cefTRIAXone (ROCEPHIN) 1 g in sodium chloride 0.9 % 100 mL IVPB        1 g 200 mL/hr over 30 Minutes Intravenous Every 24 hours 10/09/20 1938     10/09/20 1430  cefTRIAXone (ROCEPHIN) 1 g in sodium chloride 0.9 % 100 mL IVPB        1 g 200 mL/hr over 30 Minutes Intravenous  Once 10/09/20 1426 10/09/20 1600       I have personally reviewed the following labs and images: CBC: Recent Labs  Lab 10/09/20 1235 10/10/20 0752 10/11/20 0651  WBC 10.7* 10.6* 17.0*  HGB 13.5 11.9* 11.5*  HCT 40.0 35.7* 34.1*  MCV 84.4 85.2 84.4  PLT 223 171 177   BMP &GFR Recent Labs  Lab 10/09/20 1235 10/10/20 0752 10/11/20 0651  NA 140 137 140  K 4.0 3.8 4.0  CL 105 103 106  CO2 22 22 25   GLUCOSE 150* 178* 128*  BUN 16 17 23   CREATININE 1.16 1.09 1.03  CALCIUM 9.6 9.2 9.5   Estimated Creatinine Clearance: 83.4 mL/min (by C-G formula based on SCr of 1.03 mg/dL). Liver & Pancreas: Recent Labs  Lab 10/09/20 1235  AST 18  ALT 16  ALKPHOS 79  BILITOT 1.8*  PROT 9.0*  ALBUMIN 4.6   Recent Labs  Lab 10/09/20 1235  LIPASE 22   No results for input(s): AMMONIA in the last 168 hours. Diabetic: Recent Labs    10/09/20 2303  HGBA1C 4.9   No results for input(s): GLUCAP in the last 168 hours. Cardiac Enzymes: No results for input(s): CKTOTAL, CKMB, CKMBINDEX, TROPONINI in the last 168 hours. No results for input(s): PROBNP in the last 8760 hours. Coagulation Profile: Recent Labs  Lab 10/09/20 1530  INR 1.2   Thyroid Function Tests: No  results for input(s): TSH, T4TOTAL, FREET4, T3FREE, THYROIDAB in the last 72 hours. Lipid Profile: Recent Labs    10/10/20 0752  CHOL 153  HDL 40*  LDLCALC 93  TRIG 10/11/20  CHOLHDL 3.8   Anemia Panel: No results for input(s): VITAMINB12, FOLATE, FERRITIN, TIBC, IRON, RETICCTPCT in the last 72 hours. Urine analysis:    Component Value Date/Time   COLORURINE YELLOW 10/09/2020 1311   APPEARANCEUR HAZY (A) 10/09/2020 1311   LABSPEC 1.013 10/09/2020 1311   PHURINE 5.0 10/09/2020 1311   GLUCOSEU NEGATIVE 10/09/2020 1311   HGBUR SMALL (A) 10/09/2020 1311   BILIRUBINUR NEGATIVE 10/09/2020 1311   KETONESUR NEGATIVE 10/09/2020  1311   PROTEINUR 30 (A) 10/09/2020 1311   NITRITE POSITIVE (A) 10/09/2020 1311   LEUKOCYTESUR TRACE (A) 10/09/2020 1311   Sepsis Labs: Invalid input(s): PROCALCITONIN, LACTICIDVEN  Microbiology: Recent Results (from the past 240 hour(s))  Urine culture     Status: Abnormal (Preliminary result)   Collection Time: 10/09/20  1:00 PM   Specimen: Urine, Random  Result Value Ref Range Status   Specimen Description   Final    URINE, RANDOM Performed at [facility], [address] [place], [place] [address]    Special Requests   Final    NONE Performed at [facility], [address] [place], [place] [address]    Culture (A)  Final    >=100,000 COLONIES/mL ESCHERICHIA COLI SUSCEPTIBILITIES TO FOLLOW Performed at [facility], [address] [place], [place] [address]    Report Status PENDING  Incomplete  Blood culture (routine x 2)     Status: None (Preliminary result)   Collection Time: 10/09/20  3:25 PM   Specimen: BLOOD  Result Value Ref Range Status   Specimen Description   Final    BLOOD RIGHT ANTECUBITAL Performed at [facility], [address] [place], [place] [address]    Special Requests   Final    BOTTLES DRAWN AEROBIC AND ANAEROBIC Blood Culture results may not be optimal due  to an inadequate volume of blood received in culture bottles Performed at [facility], [address] [place], [place] [address]    Culture   Final    NO GROWTH 2 DAYS Performed at [facility], [address] [place], [place] [address]    Report Status PENDING  Incomplete  Resp Panel by RT-PCR (Flu A&B, Covid) Nasopharyngeal Swab     Status: None   Collection Time: 10/09/20  3:32 PM   Specimen: Nasopharyngeal Swab; Nasopharyngeal(NP) swabs in vial transport medium  Result Value Ref Range Status   SARS Coronavirus 2 by RT PCR NEGATIVE NEGATIVE Final    Comment: (NOTE) SARS-CoV-2 target nucleic acids are NOT DETECTED.  The SARS-CoV-2 RNA is generally detectable in upper respiratory specimens during the acute phase of infection. The lowest concentration of SARS-CoV-2 viral copies this assay can detect is 138 copies/mL. A negative result does not preclude SARS-Cov-2 infection and should not be used as the sole basis for treatment or other patient management decisions. A negative result may occur with  improper specimen collection/handling, submission of specimen other than nasopharyngeal swab, presence of viral mutation(s) within the areas targeted by this assay, and inadequate number of viral copies(<138 copies/mL). A negative result must be combined with clinical observations, patient history, and epidemiological information. The expected result is Negative.  Fact Sheet for Patients:  BloggerCourse.comhttps://www.fda.gov/media/152166/download  Fact Sheet for Healthcare Providers:  SeriousBroker.ithttps://www.fda.gov/media/152162/download  This test is no t yet approved or cleared by the [place] FDA and  has been authorized for detection and/or diagnosis of SARS-CoV-2 by FDA under an Emergency Use Authorization (EUA). This EUA will remain  in effect (meaning this test can be used) for the duration of the COVID-19 declaration under Section 564(b)(1) of the Act, 21 U.S.C.section  360bbb-3(b)(1), unless the authorization is terminated  or revoked sooner.       Influenza A by PCR NEGATIVE NEGATIVE Final   Influenza B by PCR NEGATIVE NEGATIVE Final    Comment: (NOTE) The Xpert Xpress SARS-CoV-2/FLU/RSV plus assay is intended as an aid in the diagnosis  of influenza from Nasopharyngeal swab specimens and should not be used as a sole basis for treatment. Nasal washings and aspirates are unacceptable for Xpert Xpress SARS-CoV-2/FLU/RSV testing.  Fact Sheet for Patients: BloggerCourse.com  Fact Sheet for Healthcare Providers: SeriousBroker.it  This test is not yet approved or cleared by the [place] FDA and has been authorized for detection and/or diagnosis of SARS-CoV-2 by FDA under an Emergency Use Authorization (EUA). This EUA will remain in effect (meaning this test can be used) for the duration of the COVID-19 declaration under Section 564(b)(1) of the Act, 21 U.S.C. section 360bbb-3(b)(1), unless the authorization is terminated or revoked.  Performed at [facility], [address], [place], [place] [address]   Blood culture (routine x 2)     Status: None (Preliminary result)   Collection Time: 10/09/20  3:35 PM   Specimen: BLOOD RIGHT HAND  Result Value Ref Range Status   Specimen Description   Final    BLOOD RIGHT HAND Performed at [facility], [address], [place], [place] [address]    Special Requests   Final    BOTTLES DRAWN AEROBIC AND ANAEROBIC Blood Culture adequate volume Performed at [facility] [address] [place], [place] [address]    Culture   Final    NO GROWTH 2 DAYS Performed at [facility], [address] [place], [place] [address]    Report Status PENDING  Incomplete    Radiology Studies: No results found.   Taye T. Gonfa [facility]  If 7PM-7AM, please contact  night-coverage [email or web address] 10/11/2020, 4:56 PM

## 2020-10-11 NOTE — Plan of Care (Signed)

## 2020-10-12 LAB — URINE CULTURE: Culture: >=100000 — AB

## 2020-10-12 LAB — BASIC METABOLIC PANEL
Anion gap: 11 (ref 5–15)
BUN: 22 mg/dL (ref 8–23)
CO2: 24 mmol/L (ref 22–32)
Calcium: 9.3 mg/dL (ref 8.9–10.3)
Chloride: 106 mmol/L (ref 98–111)
Creatinine, Ser: 1.05 mg/dL (ref 0.61–1.24)
GFR, Estimated: >60 mL/min (ref >60)
Glucose, Bld: 106 mg/dL — ABNORMAL HIGH (ref 70–99)
Potassium: 3.8 mmol/L (ref 3.5–5.1)
Sodium: 141 mmol/L (ref 135–145)

## 2020-10-12 MED ORDER — PREDNISONE 20 MG PO TABS: 40.0000 mg | ORAL_TABLET | Freq: Every day | ORAL | 0 refills | Status: DC

## 2020-10-12 MED ORDER — APIXABAN 5 MG PO TABS: 5.0000 mg | ORAL_TABLET | Freq: Two times a day (BID) | ORAL | 3 refills | Status: AC

## 2020-10-12 MED ORDER — ALBUTEROL SULFATE HFA 108 (90 BASE) MCG/ACT IN AERS: 2.0000 | INHALATION_SPRAY | Freq: Four times a day (QID) | RESPIRATORY_TRACT | 2 refills | Status: AC | PRN

## 2020-10-12 MED ORDER — LOSARTAN POTASSIUM 50 MG PO TABS: 50.0000 mg | ORAL_TABLET | Freq: Every day | ORAL | 1 refills | Status: AC

## 2020-10-12 MED ORDER — APIXABAN (ELIQUIS) VTE STARTER PACK (10MG AND 5MG): ORAL_TABLET | ORAL | 0 refills | Status: DC

## 2020-10-12 MED ORDER — APIXABAN (ELIQUIS) VTE STARTER PACK (10MG AND 5MG): ORAL_TABLET | ORAL | 0 refills | Status: AC

## 2020-10-12 NOTE — Plan of Care (Signed)
  Problem: Health Behavior/Discharge Planning: Goal: Ability to manage health-related needs will improve 10/12/2020 1206 by Charmian Muff, RN Outcome: Adequate for Discharge 10/12/2020 1054 by Charmian Muff, RN Outcome: Progressing   Problem: Clinical Measurements: Goal: Ability to maintain clinical measurements within normal limits will improve 10/12/2020 1206 by Charmian Muff, RN Outcome: Adequate for Discharge 10/12/2020 1054 by Charmian Muff, RN Outcome: Progressing Goal: Will remain free from infection 10/12/2020 1206 by Charmian Muff, RN Outcome: Adequate for Discharge 10/12/2020 1054 by Charmian Muff, RN Outcome: Progressing Goal: Diagnostic test results will improve Outcome: Adequate for Discharge Goal: Respiratory complications will improve Outcome: Adequate for Discharge Goal: Cardiovascular complication will be avoided Outcome: Adequate for Discharge

## 2020-10-12 NOTE — Plan of Care (Signed)
  Problem: Health Behavior/Discharge Planning: Goal: Ability to manage health-related needs will improve Outcome: Progressing   Problem: Clinical Measurements: Goal: Ability to maintain clinical measurements within normal limits will improve Outcome: Progressing Goal: Will remain free from infection Outcome: Progressing   

## 2020-10-12 NOTE — Care Management Important Message (Signed)
Important Message  Patient Details IM Letter given to the Patient. Name: Dalton Parrish MRN: [id number] Date of Birth: 01-04-1951   Medicare Important Message Given:  Yes     Caren Macadam 10/12/2020, 12:43 PM

## 2020-10-12 NOTE — Discharge Summary (Signed)
Physician Discharge Summary  Dalton Parrish ZOX:[id number] DOB: Jun 30, 1951 DOA: 10/09/2020  PCP: Patient, No Pcp Per  Admit date: 10/09/2020 Discharge date: 10/12/2020  Admitted From: Home Disposition: Home  Recommendations for Outpatient Follow-up:  1. Follow ups as below. 2. Please obtain CBC/BMP/Mag at follow up 3. Please follow up on the following pending results: None  Home Health: None required Equipment/Devices: None required  Discharge Condition: Stable CODE STATUS: Full code   Follow-up Information    [facility] Follow up on 11/16/2020.   Why: Your appointment is Thurs 11/16/20 at 9:10 AM. Please keep this appointment.  Contact information: [address] [place] [address] [phone]              Hospital Course: 70 year old M with PMH of HTN, COPD and tobacco use disorder presented to ED with right flank pain, and noted to have marked severity right-sided PE, hypertensive urgency and urinalysis concerning for UTI.  He was a started on IV heparin.  Patient has no provoking factors but has no PCP and hasn't had age-appropriate cancer screening.  TTE without significant finding.  Urine culture with pansensitive E. coli.  Patient remained stable.  Transitioned to Eliquis for anticoagulation.  Completed 3 days of IV ceftriaxone for UTI.  Also received systemic steroid with nebulizers for COPD.  He was discharged home to follow-up at [facility].   See individual problem list below for more on hospital course.  Discharge Diagnoses:  Acute respiratory failure due to right-sided PE-likely unprovoked.  Patient has not had age-appropriate cancer screening.  TTE without significant finding.  LE ultrasound negative for DVT.  Respiratory failure resolved. -Discharged on starter pack Eliquis.  Also given prescription for refills. -Patient needs age-appropriate cancer screenings such as  colonoscopy -Follow-up community wellness center arranged.  Acute E. coli UTI: Urine culture with pansensitive E. coli. -Completed 3 days of IV ceftriaxone.  COPD exacerbation?  Resolved.  No respiratory issues. -Discharged on prednisone for 2 more days to complete 5 days course -As needed albuterol  Tobacco use disorder: Reportedly quit smoking about a month ago. -Congratulated.  Accelerated hypertension: Resolved.  Normotensive. -Started on discharge of losartan 50 mg daily  Hyperglycemia without diagnosis of diabetes: A1c 4.9%.  Likely due to steroid.  Overweight Body mass index is 28.48 kg/m.  -Encourage lifestyle change to lose weight          Discharge Exam: Vitals:   10/12/20 0554 10/12/20 0831  BP: 125/88   Pulse: 75   Resp: 20   Temp: 98 F (36.7 C)   SpO2: 96% 96%    GENERAL: No apparent distress.  Nontoxic. HEENT: MMM.  Vision and hearing grossly intact.  NECK: Supple.  No apparent JVD.  RESP:  No IWOB.  Fair aeration bilaterally. CVS:  RRR. Heart sounds normal.  ABD/GI/GU: Bowel sounds present. Soft. Non tender.  MSK/EXT:  Moves extremities. No apparent deformity. No edema.  SKIN: no apparent skin lesion or wound NEURO: Awake, alert and oriented appropriately.  No apparent focal neuro deficit. PSYCH: Calm. Normal affect.  Discharge Instructions  Discharge Instructions    Call MD for:  difficulty breathing, headache or visual disturbances   Complete by: As directed    Call MD for:  extreme fatigue   Complete by: As directed    Call MD for:  persistant dizziness or light-headedness   Complete by: As directed    Call MD for:  severe uncontrolled  pain   Complete by: As directed    Diet - low sodium heart healthy   Complete by: As directed    Discharge instructions   Complete by: As directed    It has been a pleasure taking care of you!  You were hospitalized due to pulmonary embolism (blood clot in your lung), COPD exacerbation and  urinary tract infection.  You have been started on Eliquis (a blood thinner) for blood clot.  It is very important that you take this medication as prescribed.  We also recommend you avoid over-the-counter pain medication other than plain Tylenol while taking Eliquis.  You have been treated with prednisone, antibiotics and breathing treatments for COPD exacerbation.  We are discharging you more prednisone to complete treatment course.  You have been treated with IV antibiotic for urinary tract infection.  We have also started you on losartan for high blood pressure.  Please follow-up with your primary care doctor or establish care with new primary care doctor in about 2 weeks.  Congratulations on stopping smoking cigarettes!     Take care,   Increase activity slowly   Complete by: As directed      Allergies as of 10/12/2020   No Known Allergies     Medication List    STOP taking these medications   methocarbamol 500 MG tablet Commonly known as: ROBAXIN   traMADol 50 MG tablet Commonly known as: ULTRAM     TAKE these medications   albuterol 108 (90 Base) MCG/ACT inhaler Commonly known as: VENTOLIN HFA Inhale 2 puffs into the lungs every 6 (six) hours as needed for wheezing or shortness of breath.   Apixaban Starter Pack (10mg  and 5mg ) Commonly known as: ELIQUIS STARTER PACK Take as directed on package: start with two-5mg  tablets twice daily for 7 days. On day 8, switch to one-5mg  tablet twice daily.   apixaban 5 MG Tabs tablet Commonly known as: ELIQUIS Take 1 tablet (5 mg total) by mouth 2 (two) times daily. Start after you finish the starter pack Start taking on: November 11, 2020   losartan 50 MG tablet Commonly known as: COZAAR Take 1 tablet (50 mg total) by mouth daily.   predniSONE 20 MG tablet Commonly known as: DELTASONE Take 2 tablets (40 mg total) by mouth daily with breakfast for 2 days. What changed:   how much to take  how to take this  when to take  this  additional instructions       Consultations:  None  Procedures/Studies:  2D Echo on 10/10/2020 1. Left ventricular ejection fraction, by estimation, is 60 to 65%. The  left ventricle has normal function. The left ventricle has no regional  wall motion abnormalities. Left ventricular diastolic parameters were  normal.  2. Right ventricular systolic function is normal. The right ventricular  size is normal. There is normal pulmonary artery systolic pressure.  3. The mitral valve is normal in structure. No evidence of mitral valve  regurgitation. No evidence of mitral stenosis.  4. The aortic valve is normal in structure. Aortic valve regurgitation is  not visualized. No aortic stenosis is present.  5. The inferior vena cava is normal in size with greater than 50%  respiratory variability, suggesting right atrial pressure of 3 mmHg.    CT Angio Chest PE W and/or Wo Contrast  Result Date: 10/09/2020 CLINICAL DATA:  Shortness of breath and wheezing. EXAM: CT ANGIOGRAPHY CHEST WITH CONTRAST TECHNIQUE: Multidetector CT imaging of the chest was performed using the  standard protocol during bolus administration of intravenous contrast. Multiplanar CT image reconstructions and MIPs were obtained to evaluate the vascular anatomy. CONTRAST:  75mL OMNIPAQUE IOHEXOL 350 MG/ML SOLN COMPARISON:  None. FINDINGS: Cardiovascular: The thoracic aorta is normal in appearance. Marked severity intraluminal filling defects are seen involving the distal aspect of the right pulmonary artery. Extensive involvement of the middle lobe and lower lobe branches is seen. There is no evidence of saddle embolus. Normal heart size without evidence of right heart strain. No pericardial effusion. Mediastinum/Nodes: No enlarged mediastinal, hilar, or axillary lymph nodes. Thyroid gland, trachea, and esophagus demonstrate no significant findings. Lungs/Pleura: There is mild emphysematous lung disease. Mild areas of  atelectasis and/or infiltrate are seen within the inferior aspect of the left upper lobe and posterior aspects of the right upper lobe, right middle lobe and left lung base. Moderate severity atelectasis and/or infiltrate is seen within the right lower lobe. There is no evidence of a pleural effusion or pneumothorax. Upper Abdomen: There is a small hiatal hernia. Musculoskeletal: A 10.5 cm x 3.2 cm subcutaneous lipoma is seen within the soft tissues of the back, to the left of midline. Degenerative changes seen throughout the thoracic spine. Review of the MIP images confirms the above findings. IMPRESSION: 1. Marked severity right-sided pulmonary embolism without evidence of saddle embolus or right heart strain. 2. Mild to moderate severity bilateral atelectasis and/or infiltrate. 3. Small hiatal hernia. 4. Emphysema. Emphysema (ICD10-J43.9). Electronically Signed   By: Aram Candelahaddeus  Houston M.D.   On: 10/09/2020 16:02   CT ABDOMEN PELVIS W CONTRAST  Result Date: 10/09/2020 CLINICAL DATA:  70 year old male with abdominal infection suspected EXAM: CT ABDOMEN AND PELVIS WITH CONTRAST TECHNIQUE: Multidetector CT imaging of the abdomen and pelvis was performed using the standard protocol following bolus administration of intravenous contrast. CONTRAST:  100mL OMNIPAQUE IOHEXOL 300 MG/ML  SOLN COMPARISON:  None. FINDINGS: Lower chest: Peripheral nodular opacities at the right costophrenic sulcus, within the right lower lobe. Partial volume loss of the right lower lobe and the right middle lobe. Questionable hypodensity linear filling defects within the pulmonary arteries of the right lower lobe. These are better appreciated on the coronal images. Hepatobiliary: Subcentimeter rounded hypodensity of the right liver dome, most likely a benign biliary cyst though nonspecific. Gallbladder is decompressed. No inflammatory changes at the gallbladder fossa. Pancreas: Unremarkable pancreas Spleen: Unremarkable spleen  Adrenals/Urinary Tract: - Right adrenal gland:  Unremarkable - Left adrenal gland: Unremarkable. - Right kidney: No hydronephrosis, nephrolithiasis, inflammation, or ureteral dilation. Subcentimeter hypodense lesion at the anterior cortex of the right kidney, most likely a benign cyst though strictly too small to characterize. - Left Kidney: No hydronephrosis, nephrolithiasis, inflammation, or ureteral dilation. Subcentimeter hypodense lesion at the posterior cortex of the left kidney at the hilum, most likely benign cyst though strictly too small to characterize. - Urinary Bladder: Unremarkable. Stomach/Bowel: - Stomach: Stomach demonstrates some gaseous distension though otherwise unremarkable. Hiatal hernia. No evidence of gastric outlet obstruction. - Small bowel: Gas filled small bowel loops distally without transition point or wall thickening. No focal inflammatory changes or interloop fluid. - Appendix: Normal. - Colon: Formed stool within right colon sigmoid colon and rectum. Mild gaseous distension of the transverse colon. No focal wall thickening or focal inflammatory changes. Vascular/Lymphatic: Atherosclerotic changes of the abdominal aorta and the iliac arteries. Bilateral iliac arteries and proximal femoral arteries are patent. Reproductive: Unremarkable prostate Other: Bilateral fat containing inguinal hernia. Fat containing umbilical hernia without inflammatory changes or evidence of obstruction.  Musculoskeletal: Degenerative changes of the visualized thoracolumbar spine. No acute displaced fracture. Vacuum disc phenomenon at L2-L3, L4-L5, L5-S1. IMPRESSION: CT negative for acute intra-abdominal process. Incidentally imaged filling defects of the right lower lobe and right middle lobe, which are favored to represent pulmonary emboli and associated right middle lobe/lower lobe consolidation. Further evaluation with a dedicated CTA chest PE protocol is indicated for confirmation. These results were  discussed by telephone at the time of interpretation on 10/09/2020 at 2:56 pm with [facility],. Borderline dilated small bowel and colon, without evidence of obstruction. This is nonspecific, potentially representing enteritis, slow transit of bowel contents, in/or reflective of constipation/obstipation. Electronically Signed   By: Gilmer Mor D.O.   On: 10/09/2020 15:05   ECHOCARDIOGRAM COMPLETE  Result Date: 10/10/2020    ECHOCARDIOGRAM REPORT   Patient Name:   ARNALDO HEFFRON Date of Exam: 10/10/2020 Medical Rec #:  [id number]     Height:       72.0 in Accession #:    [id number]    Weight:       210.0 lb Date of Birth:  1950-09-19    BSA:          2.175 m Patient Age:    69 years      BP:           134/86 mmHg Patient Gender: M             HR:           84 bpm. Exam Location:  Inpatient Procedure: 2D Echo, Cardiac Doppler and Color Doppler Indications:    Pulmonary embolus  History:        Patient has no prior history of Echocardiogram examinations.                 COPD, Signs/Symptoms:Chest Pain and Shortness of Breath; Risk                 Factors:Hypertension and Current Smoker.  Sonographer:    Lavenia Atlas Referring Phys: [phone] RIPUDEEP K RAI  Sonographer Comments: Suboptimal subcostal window. IMPRESSIONS  1. Left ventricular ejection fraction, by estimation, is 60 to 65%. The left ventricle has normal function. The left ventricle has no regional wall motion abnormalities. Left ventricular diastolic parameters were normal.  2. Right ventricular systolic function is normal. The right ventricular size is normal. There is normal pulmonary artery systolic pressure.  3. The mitral valve is normal in structure. No evidence of mitral valve regurgitation. No evidence of mitral stenosis.  4. The aortic valve is normal in structure. Aortic valve regurgitation is not visualized. No aortic stenosis is present.  5. The inferior vena cava is normal in size with greater than 50% respiratory variability,  suggesting right atrial pressure of 3 mmHg. FINDINGS  Left Ventricle: Left ventricular ejection fraction, by estimation, is 60 to 65%. The left ventricle has normal function. The left ventricle has no regional wall motion abnormalities. The left ventricular internal cavity size was normal in size. There is  no left ventricular hypertrophy of the basal-septal segment. Left ventricular diastolic parameters were normal. Right Ventricle: The right ventricular size is normal. No increase in right ventricular wall thickness. Right ventricular systolic function is normal. There is normal pulmonary artery systolic pressure. The tricuspid regurgitant velocity is 2.21 m/s, and  with an assumed right atrial pressure of 3 mmHg, the estimated right ventricular systolic pressure is 22.5 mmHg. Left Atrium: Left atrial size was normal in size. Right Atrium: Right atrial  size was normal in size. Pericardium: There is no evidence of pericardial effusion. Mitral Valve: The mitral valve is normal in structure. No evidence of mitral valve regurgitation. No evidence of mitral valve stenosis. Tricuspid Valve: The tricuspid valve is normal in structure. Tricuspid valve regurgitation is not demonstrated. No evidence of tricuspid stenosis. Aortic Valve: The aortic valve is normal in structure. Aortic valve regurgitation is not visualized. No aortic stenosis is present. Pulmonic Valve: The pulmonic valve was normal in structure. Pulmonic valve regurgitation is not visualized. No evidence of pulmonic stenosis. Aorta: The aortic root is normal in size and structure. Venous: The inferior vena cava is normal in size with greater than 50% respiratory variability, suggesting right atrial pressure of 3 mmHg. IAS/Shunts: No atrial level shunt detected by color flow Doppler.  LEFT VENTRICLE PLAX 2D LVIDd:         4.40 cm Diastology LVIDs:         2.80 cm LV e' medial:    9.03 cm/s LV PW:         1.10 cm LV E/e' medial:  8.8 LV IVS:        1.30 cm LV  e' lateral:   10.20 cm/s                        LV E/e' lateral: 7.8  RIGHT VENTRICLE RV Basal diam:  2.60 cm RV S prime:     14.00 cm/s TAPSE (M-mode): 2.4 cm LEFT ATRIUM             Index       RIGHT ATRIUM           Index LA diam:        3.30 cm 1.52 cm/m  RA Area:     12.60 cm LA Vol (A2C):   49.6 ml 22.80 ml/m RA Volume:   26.40 ml  12.14 ml/m LA Vol (A4C):   40.9 ml 18.80 ml/m LA Biplane Vol: 48.9 ml 22.48 ml/m  AORTIC VALVE LVOT Vmax:   135.00 cm/s LVOT Vmean:  100.000 cm/s LVOT VTI:    0.285 m  AORTA Ao Root diam: 3.30 cm MITRAL VALVE               TRICUSPID VALVE MV Area (PHT): 4.89 cm    TR Peak grad:   19.5 mmHg MV Decel Time: 155 msec    TR Vmax:        221.00 cm/s MV E velocity: 79.70 cm/s MV A velocity: 97.30 cm/s  SHUNTS MV E/A ratio:  0.82        Systemic VTI: 0.29 m Rachelle Hora Croitoru MD Electronically signed by Thurmon Fair MD Signature Date/Time: 10/10/2020/3:22:59 PM    Final    VAS [place] LOWER EXTREMITY VENOUS (DVT)  Result Date: 10/10/2020  Lower Venous DVT Study Indications: Pulmonary embolism, and Pain when breathing.  Comparison Study: No previous study Performing Technologist: Ernestene Mention  Examination Guidelines: A complete evaluation includes B-mode imaging, spectral Doppler, color Doppler, and power Doppler as needed of all accessible portions of each vessel. Bilateral testing is considered an integral part of a complete examination. Limited examinations for reoccurring indications may be performed as noted. The reflux portion of the exam is performed with the patient in reverse Trendelenburg.  +---------+---------------+---------+-----------+----------+--------------+ RIGHT    CompressibilityPhasicitySpontaneityPropertiesThrombus Aging +---------+---------------+---------+-----------+----------+--------------+ CFV      Full           Yes      Yes                                 +---------+---------------+---------+-----------+----------+--------------+  SFJ      Full                                                         +---------+---------------+---------+-----------+----------+--------------+ FV Prox  Full           Yes      Yes                                 +---------+---------------+---------+-----------+----------+--------------+ FV Mid   Full           Yes      Yes                                 +---------+---------------+---------+-----------+----------+--------------+ FV DistalFull           Yes      Yes                                 +---------+---------------+---------+-----------+----------+--------------+ PFV      Full                                                        +---------+---------------+---------+-----------+----------+--------------+ POP      Full           Yes      Yes                                 +---------+---------------+---------+-----------+----------+--------------+ PTV      Full                                                        +---------+---------------+---------+-----------+----------+--------------+ PERO     Full                                                        +---------+---------------+---------+-----------+----------+--------------+   +---------+---------------+---------+-----------+----------+--------------+ LEFT     CompressibilityPhasicitySpontaneityPropertiesThrombus Aging +---------+---------------+---------+-----------+----------+--------------+ CFV      Full           Yes      Yes                                 +---------+---------------+---------+-----------+----------+--------------+ SFJ      Full                                                        +---------+---------------+---------+-----------+----------+--------------+  FV Prox  Full           Yes      Yes                                 +---------+---------------+---------+-----------+----------+--------------+ FV Mid   Full           Yes      Yes                                  +---------+---------------+---------+-----------+----------+--------------+ FV DistalFull           Yes      Yes                                 +---------+---------------+---------+-----------+----------+--------------+ PFV      Full                                                        +---------+---------------+---------+-----------+----------+--------------+ POP      Full           Yes      Yes                                 +---------+---------------+---------+-----------+----------+--------------+ PTV      Full                                                        +---------+---------------+---------+-----------+----------+--------------+ PERO     Full                                                        +---------+---------------+---------+-----------+----------+--------------+     Summary: BILATERAL: - No evidence of deep vein thrombosis seen in the lower extremities, bilaterally. - No evidence of superficial venous thrombosis in the lower extremities, bilaterally. -No evidence of popliteal cyst, bilaterally.   *See table(s) above for measurements and observations. Electronically signed by Waverly Ferrari MD on 10/10/2020 at 12:34:30 PM.    Final         The results of significant diagnostics from this hospitalization (including imaging, microbiology, ancillary and laboratory) are listed below for reference.     Microbiology: Recent Results (from the past 240 hour(s))  Urine culture     Status: Abnormal   Collection Time: 10/09/20  1:00 PM   Specimen: Urine, Random  Result Value Ref Range Status   Specimen Description   Final    URINE, RANDOM Performed at [facility], [address] [place], [place] [address]    Special Requests   Final    NONE Performed at [facility], [address], [place], [place] [address]    Culture >=100,000 COLONIES/mL ESCHERICHIA COLI (A)  Final   Report Status  10/12/2020 FINAL  Final   Organism ID, Bacteria ESCHERICHIA COLI (A)  Final      Susceptibility   Escherichia coli - MIC*    AMPICILLIN <=2 SENSITIVE Sensitive     CEFAZOLIN <=4 SENSITIVE Sensitive     CEFEPIME <=0.12 SENSITIVE Sensitive     CEFTRIAXONE <=0.25 SENSITIVE Sensitive     CIPROFLOXACIN <=0.25 SENSITIVE Sensitive     GENTAMICIN <=1 SENSITIVE Sensitive     IMIPENEM <=0.25 SENSITIVE Sensitive     NITROFURANTOIN <=16 SENSITIVE Sensitive     TRIMETH/SULFA <=20 SENSITIVE Sensitive     AMPICILLIN/SULBACTAM <=2 SENSITIVE Sensitive     PIP/TAZO <=4 SENSITIVE Sensitive     * >=100,000 COLONIES/mL ESCHERICHIA COLI  Blood culture (routine x 2)     Status: None (Preliminary result)   Collection Time: 10/09/20  3:25 PM   Specimen: BLOOD  Result Value Ref Range Status   Specimen Description   Final    BLOOD RIGHT ANTECUBITAL Performed at [facility], [address] [place], [place] [address]    Special Requests   Final    BOTTLES DRAWN AEROBIC AND ANAEROBIC Blood Culture results may not be optimal due to an inadequate volume of blood received in culture bottles Performed at [facility], [address] [place], [place] [address]    Culture   Final    NO GROWTH 3 DAYS Performed at [facility], [address], [place], [place] [address]    Report Status PENDING  Incomplete  Resp Panel by RT-PCR (Flu A&B, Covid) Nasopharyngeal Swab     Status: None   Collection Time: 10/09/20  3:32 PM   Specimen: Nasopharyngeal Swab; Nasopharyngeal(NP) swabs in vial transport medium  Result Value Ref Range Status   SARS Coronavirus 2 by RT PCR NEGATIVE NEGATIVE Final    Comment: (NOTE) SARS-CoV-2 target nucleic acids are NOT DETECTED.  The SARS-CoV-2 RNA is generally detectable in upper respiratory specimens during the acute phase of infection. The lowest concentration of SARS-CoV-2 viral copies this assay can detect is 138 copies/mL. A negative  result does not preclude SARS-Cov-2 infection and should not be used as the sole basis for treatment or other patient management decisions. A negative result may occur with  improper specimen collection/handling, submission of specimen other than nasopharyngeal swab, presence of viral mutation(s) within the areas targeted by this assay, and inadequate number of viral copies(<138 copies/mL). A negative result must be combined with clinical observations, patient history, and epidemiological information. The expected result is Negative.  Fact Sheet for Patients:  BloggerCourse.com  Fact Sheet for Healthcare Providers:  SeriousBroker.it  This test is no t yet approved or cleared by the [place] FDA and  has been authorized for detection and/or diagnosis of SARS-CoV-2 by FDA under an Emergency Use Authorization (EUA). This EUA will remain  in effect (meaning this test can be used) for the duration of the COVID-19 declaration under Section 564(b)(1) of the Act, 21 U.S.C.section 360bbb-3(b)(1), unless the authorization is terminated  or revoked sooner.       Influenza A by PCR NEGATIVE NEGATIVE Final   Influenza B by PCR NEGATIVE NEGATIVE Final    Comment: (NOTE) The Xpert Xpress SARS-CoV-2/FLU/RSV plus assay is intended as an aid in the diagnosis of influenza from Nasopharyngeal swab specimens and should not be used as a sole basis for treatment. Nasal washings and aspirates are unacceptable for Xpert Xpress SARS-CoV-2/FLU/RSV testing.  Fact Sheet for Patients: BloggerCourse.com  Fact  Sheet for Healthcare Providers: SeriousBroker.it  This test is not yet approved or cleared by the [place] and has been authorized for detection and/or diagnosis of SARS-CoV-2 by FDA under an Emergency Use Authorization (EUA). This EUA will remain in effect (meaning this test can be used)  for the duration of the COVID-19 declaration under Section 564(b)(1) of the Act, 21 U.S.C. section 360bbb-3(b)(1), unless the authorization is terminated or revoked.  Performed at [facility], [address], [place], [place] [address]   Blood culture (routine x 2)     Status: None (Preliminary result)   Collection Time: 10/09/20  3:35 PM   Specimen: BLOOD RIGHT HAND  Result Value Ref Range Status   Specimen Description   Final    BLOOD RIGHT HAND Performed at [facility], [address] [place], [place] [address]    Special Requests   Final    BOTTLES DRAWN AEROBIC AND ANAEROBIC Blood Culture adequate volume Performed at [facility], [address] [place], [place] [address]    Culture   Final    NO GROWTH 3 DAYS Performed at [facility], [address], [place], [place] [address]    Report Status PENDING  Incomplete     Labs:  CBC: Recent Labs  Lab 10/09/20 1235 10/10/20 0752 10/11/20 0651  WBC 10.7* 10.6* 17.0*  HGB 13.5 11.9* 11.5*  HCT 40.0 35.7* 34.1*  MCV 84.4 85.2 84.4  PLT 223 171 177   BMP &GFR Recent Labs  Lab 10/09/20 1235 10/10/20 0752 10/11/20 0651 10/12/20 0427  NA 140 137 140 141  K 4.0 3.8 4.0 3.8  CL 105 103 106 106  CO2 GLUCOSE 150* 178* 128* 106*  BUN CREATININE 1.16 1.09 1.03 1.05  CALCIUM 9.6 9.2 9.5 9.3   Estimated Creatinine Clearance: 81.8 mL/min (by C-G formula based on SCr of 1.05 mg/dL). Liver & Pancreas: Recent Labs  Lab 10/09/20 1235  AST 18  ALT 16  ALKPHOS 79  BILITOT 1.8*  PROT 9.0*  ALBUMIN 4.6   Recent Labs  Lab 10/09/20 1235  LIPASE 22   No results for input(s): AMMONIA in the last 168 hours. Diabetic: Recent Labs    10/09/20 2303  HGBA1C 4.9   No results for input(s): GLUCAP in the last 168 hours. Cardiac Enzymes: No results for input(s): CKTOTAL, CKMB, CKMBINDEX, TROPONINI in the last 168 hours. No  results for input(s): PROBNP in the last 8760 hours. Coagulation Profile: Recent Labs  Lab 10/09/20 1530  INR 1.2   Thyroid Function Tests: No results for input(s): TSH, T4TOTAL, FREET4, T3FREE, THYROIDAB in the last 72 hours. Lipid Profile: Recent Labs    10/10/20 0752  CHOL 153  HDL 40*  LDLCALC 93  TRIG 295  CHOLHDL 3.8   Anemia Panel: No results for input(s): VITAMINB12, FOLATE, FERRITIN, TIBC, IRON, RETICCTPCT in the last 72 hours. Urine analysis:    Component Value Date/Time   COLORURINE YELLOW 10/09/2020 1311   APPEARANCEUR HAZY (A) 10/09/2020 1311   LABSPEC 1.013 10/09/2020 1311   PHURINE 5.0 10/09/2020 1311   GLUCOSEU NEGATIVE 10/09/2020 1311   HGBUR SMALL (A) 10/09/2020 1311   BILIRUBINUR NEGATIVE 10/09/2020 1311   KETONESUR NEGATIVE 10/09/2020 1311   PROTEINUR 30 (A) 10/09/2020 1311   NITRITE POSITIVE (A) 10/09/2020 1311   LEUKOCYTESUR TRACE (A) 10/09/2020 1311   Sepsis Labs: Invalid input(s): PROCALCITONIN, LACTICIDVEN  Time coordinating discharge: 35 minutes  SIGNED:  Almon Hercules, MD  [facility] 10/12/2020, 5:17 PM  If 7PM-7AM, please contact night-coverage [email or web address]

## 2020-10-12 NOTE — Progress Notes (Signed)
Pt discharged to home, instructions reviewed with pt; acknowledged understanding of instructions. Pt updated on Eliqiuas and the usage and side effects of the meds reviewed.  Pt acknowledged understanding. SRP, RN

## 2020-10-14 ENCOUNTER — Other Ambulatory Visit: Payer: Self-pay

## 2020-10-14 ENCOUNTER — Encounter (HOSPITAL_COMMUNITY): Payer: Self-pay | Admitting: *Deleted

## 2020-10-14 ENCOUNTER — Emergency Department (HOSPITAL_COMMUNITY): Payer: Self-pay

## 2020-10-14 ENCOUNTER — Observation Stay (HOSPITAL_COMMUNITY)
Admission: EM | Admit: 2020-10-14 | Discharge: 2020-10-15 | Disposition: A | Discharge status: Home / Self Care | Payer: Self-pay | Attending: Internal Medicine | Admitting: Internal Medicine

## 2020-10-14 DIAGNOSIS — J189 Pneumonia, unspecified organism: Secondary | ICD-10-CM | POA: Insufficient documentation

## 2020-10-14 DIAGNOSIS — I10 Essential (primary) hypertension: Secondary | ICD-10-CM | POA: Insufficient documentation

## 2020-10-14 DIAGNOSIS — Z79899 Other long term (current) drug therapy: Secondary | ICD-10-CM | POA: Insufficient documentation

## 2020-10-14 DIAGNOSIS — Z87891 Personal history of nicotine dependence: Secondary | ICD-10-CM | POA: Insufficient documentation

## 2020-10-14 DIAGNOSIS — Z20822 Contact with and (suspected) exposure to covid-19: Secondary | ICD-10-CM | POA: Insufficient documentation

## 2020-10-14 DIAGNOSIS — Z7901 Long term (current) use of anticoagulants: Secondary | ICD-10-CM | POA: Insufficient documentation

## 2020-10-14 DIAGNOSIS — R109 Unspecified abdominal pain: Secondary | ICD-10-CM | POA: Diagnosis present

## 2020-10-14 DIAGNOSIS — I2699 Other pulmonary embolism without acute cor pulmonale: Principal | ICD-10-CM | POA: Insufficient documentation

## 2020-10-14 HISTORY — DX: Essential (primary) hypertension: I10

## 2020-10-14 LAB — CBC WITH DIFFERENTIAL/PLATELET
Abs Immature Granulocytes: 0.1 10*3/uL — ABNORMAL HIGH (ref 0.00–0.07)
Basophils Absolute: 0 10*3/uL (ref 0.0–0.1)
Basophils Relative: 0 %
Eosinophils Absolute: 0.1 10*3/uL (ref 0.0–0.5)
Eosinophils Relative: 1 %
HCT: 39.2 % (ref 39.0–52.0)
Hemoglobin: 13.1 g/dL (ref 13.0–17.0)
Immature Granulocytes: 1 %
Lymphocytes Relative: 13 %
Lymphs Abs: 1.2 10*3/uL (ref 0.7–4.0)
MCH: 28.1 pg (ref 26.0–34.0)
MCHC: 33.4 g/dL (ref 30.0–36.0)
MCV: 84.1 fL (ref 80.0–100.0)
Monocytes Absolute: 0.9 10*3/uL (ref 0.1–1.0)
Monocytes Relative: 9 %
Neutro Abs: 7.1 10*3/uL (ref 1.7–7.7)
Neutrophils Relative %: 76 %
Platelets: 266 10*3/uL (ref 150–400)
RBC: 4.66 MIL/uL (ref 4.22–5.81)
RDW: 14.3 % (ref 11.5–15.5)
WBC: 9.4 10*3/uL (ref 4.0–10.5)
nRBC: 0 % (ref 0.0–0.2)

## 2020-10-14 LAB — COMPREHENSIVE METABOLIC PANEL
ALT: 27 U/L (ref 0–44)
AST: 16 U/L (ref 15–41)
Albumin: 4.1 g/dL (ref 3.5–5.0)
Alkaline Phosphatase: 73 U/L (ref 38–126)
Anion gap: 10 (ref 5–15)
BUN: 18 mg/dL (ref 8–23)
CO2: 22 mmol/L (ref 22–32)
Calcium: 9.6 mg/dL (ref 8.9–10.3)
Chloride: 108 mmol/L (ref 98–111)
Creatinine, Ser: 0.95 mg/dL (ref 0.61–1.24)
GFR, Estimated: >60 mL/min (ref >60)
Glucose, Bld: 118 mg/dL — ABNORMAL HIGH (ref 70–99)
Potassium: 3.6 mmol/L (ref 3.5–5.1)
Sodium: 140 mmol/L (ref 135–145)
Total Bilirubin: 1.1 mg/dL (ref 0.3–1.2)
Total Protein: 7.9 g/dL (ref 6.5–8.1)

## 2020-10-14 LAB — CULTURE, BLOOD (ROUTINE X 2)
Culture: NO GROWTH
Culture: NO GROWTH

## 2020-10-14 LAB — TROPONIN I (HIGH SENSITIVITY)
Troponin I (High Sensitivity): 4 ng/L (ref <18)
Troponin I (High Sensitivity): 3 ng/L (ref <18)

## 2020-10-14 LAB — URINALYSIS, ROUTINE W REFLEX MICROSCOPIC
Bilirubin Urine: NEGATIVE
Glucose, UA: NEGATIVE mg/dL
Hgb urine dipstick: NEGATIVE
Ketones, ur: NEGATIVE mg/dL
Leukocytes,Ua: NEGATIVE
Nitrite: NEGATIVE
Protein, ur: NEGATIVE mg/dL
Specific Gravity, Urine: 1.013 (ref 1.005–1.030)
pH: 5 (ref 5.0–8.0)

## 2020-10-14 LAB — PROCALCITONIN: Procalcitonin: <0.1 ng/mL

## 2020-10-14 LAB — SARS CORONAVIRUS 2 (TAT 6-24 HRS): SARS Coronavirus 2: NEGATIVE

## 2020-10-14 LAB — LIPASE, BLOOD: Lipase: 23 U/L (ref 11–51)

## 2020-10-14 MED ORDER — SODIUM CHLORIDE 0.9 % IV SOLN
2.0000 g | INTRAVENOUS | Status: DC
  Administered 2020-10-15: 2 g via INTRAVENOUS
  Filled 2020-10-14: qty 20

## 2020-10-14 MED ORDER — LOSARTAN POTASSIUM 50 MG PO TABS
50.0000 mg | ORAL_TABLET | Freq: Every day | ORAL | Status: DC
  Administered 2020-10-14 – 2020-10-15 (×2): 50 mg via ORAL
  Filled 2020-10-14 (×2): qty 1

## 2020-10-14 MED ORDER — MORPHINE SULFATE (PF) 4 MG/ML IV SOLN
4.0000 mg | Freq: Once | INTRAVENOUS | Status: AC
Start: 2020-10-14 — End: 2020-10-14
  Administered 2020-10-14: 4 mg via INTRAVENOUS
  Filled 2020-10-14: qty 1

## 2020-10-14 MED ORDER — DOCUSATE SODIUM 100 MG PO CAPS
100.0000 mg | ORAL_CAPSULE | Freq: Two times a day (BID) | ORAL | Status: DC
  Administered 2020-10-14: 100 mg via ORAL
  Filled 2020-10-14 (×3): qty 1

## 2020-10-14 MED ORDER — SODIUM CHLORIDE 0.9 % IV BOLUS
1000.0000 mL | Freq: Once | INTRAVENOUS | Status: AC
  Administered 2020-10-14: 1000 mL via INTRAVENOUS

## 2020-10-14 MED ORDER — AZITHROMYCIN 250 MG PO TABS
500.0000 mg | ORAL_TABLET | Freq: Every day | ORAL | Status: DC
  Administered 2020-10-15: 500 mg via ORAL
  Filled 2020-10-14: qty 2

## 2020-10-14 MED ORDER — IPRATROPIUM-ALBUTEROL 0.5-2.5 (3) MG/3ML IN SOLN
3.0000 mL | Freq: Four times a day (QID) | RESPIRATORY_TRACT | Status: DC
  Administered 2020-10-14: 3 mL via RESPIRATORY_TRACT
  Filled 2020-10-14: qty 3

## 2020-10-14 MED ORDER — IOHEXOL 300 MG/ML  SOLN
100.0000 mL | Freq: Once | INTRAMUSCULAR | Status: AC | PRN
  Administered 2020-10-14: 100 mL via INTRAVENOUS

## 2020-10-14 MED ORDER — POLYETHYLENE GLYCOL 3350 17 G PO PACK: 17.0000 g | PACK | Freq: Every day | ORAL | Status: DC | PRN

## 2020-10-14 MED ORDER — ONDANSETRON HCL 4 MG/2ML IJ SOLN: 4.0000 mg | Freq: Four times a day (QID) | INTRAMUSCULAR | Status: DC | PRN

## 2020-10-14 MED ORDER — APIXABAN 5 MG PO TABS: 5.0000 mg | ORAL_TABLET | Freq: Two times a day (BID) | ORAL | Status: DC

## 2020-10-14 MED ORDER — APIXABAN 5 MG PO TABS
10.0000 mg | ORAL_TABLET | Freq: Two times a day (BID) | ORAL | Status: DC
  Administered 2020-10-14 – 2020-10-15 (×3): 10 mg via ORAL
  Filled 2020-10-14 (×3): qty 2

## 2020-10-14 MED ORDER — ACETAMINOPHEN 650 MG RE SUPP: 650.0000 mg | Freq: Four times a day (QID) | RECTAL | Status: DC | PRN

## 2020-10-14 MED ORDER — OXYCODONE HCL 5 MG PO TABS
5.0000 mg | ORAL_TABLET | ORAL | Status: DC | PRN
  Administered 2020-10-14 – 2020-10-15 (×2): 5 mg via ORAL
  Filled 2020-10-14 (×2): qty 1

## 2020-10-14 MED ORDER — SODIUM CHLORIDE 0.9 % IV SOLN
1.0000 g | Freq: Once | INTRAVENOUS | Status: AC
  Administered 2020-10-14: 1 g via INTRAVENOUS
  Filled 2020-10-14: qty 10

## 2020-10-14 MED ORDER — SODIUM CHLORIDE 0.9 % IV SOLN
500.0000 mg | Freq: Once | INTRAVENOUS | Status: AC
  Administered 2020-10-14: 500 mg via INTRAVENOUS
  Filled 2020-10-14: qty 500

## 2020-10-14 MED ORDER — HYDROMORPHONE HCL 1 MG/ML IJ SOLN
1.0000 mg | Freq: Once | INTRAMUSCULAR | Status: AC
  Administered 2020-10-14: 1 mg via INTRAVENOUS
  Filled 2020-10-14: qty 1

## 2020-10-14 MED ORDER — IPRATROPIUM-ALBUTEROL 0.5-2.5 (3) MG/3ML IN SOLN
3.0000 mL | Freq: Three times a day (TID) | RESPIRATORY_TRACT | Status: DC
  Administered 2020-10-15: 3 mL via RESPIRATORY_TRACT
  Filled 2020-10-14: qty 3

## 2020-10-14 MED ORDER — MORPHINE SULFATE (PF) 2 MG/ML IV SOLN: 2.0000 mg | INTRAVENOUS | Status: DC | PRN

## 2020-10-14 MED ORDER — ONDANSETRON HCL 4 MG PO TABS: 4.0000 mg | ORAL_TABLET | Freq: Four times a day (QID) | ORAL | Status: DC | PRN

## 2020-10-14 MED ORDER — ALBUTEROL SULFATE HFA 108 (90 BASE) MCG/ACT IN AERS: 2.0000 | INHALATION_SPRAY | Freq: Four times a day (QID) | RESPIRATORY_TRACT | Status: DC | PRN

## 2020-10-14 MED ORDER — ACETAMINOPHEN 325 MG PO TABS: 650.0000 mg | ORAL_TABLET | Freq: Four times a day (QID) | ORAL | Status: DC | PRN

## 2020-10-14 MED ORDER — NICOTINE 14 MG/24HR TD PT24
14.0000 mg | MEDICATED_PATCH | Freq: Every day | TRANSDERMAL | Status: DC
  Filled 2020-10-14 (×2): qty 1

## 2020-10-14 MED ORDER — IPRATROPIUM-ALBUTEROL 20-100 MCG/ACT IN AERS
1.0000 | INHALATION_SPRAY | Freq: Once | RESPIRATORY_TRACT | Status: DC
  Filled 2020-10-14: qty 4

## 2020-10-14 MED ORDER — PREDNISONE 20 MG PO TABS
40.0000 mg | ORAL_TABLET | Freq: Every day | ORAL | Status: DC
  Administered 2020-10-14 – 2020-10-15 (×2): 40 mg via ORAL
  Filled 2020-10-14 (×2): qty 2

## 2020-10-14 NOTE — ED Provider Notes (Signed)
[facility]-EMERGENCY DEPT Provider Note   CSN: [id number] Arrival date & time: 10/14/20  6283     History No chief complaint on file.   Dalton Parrish is a 70 y.o. male who presents to the ED via EMS with complaint of gradual onset, constant, sharp, right flank pain that began yesterday.  Per chart review patient was just admitted to the hospital on 3/14-3/17 secondary to right-sided flank pain and found to have a UTI.  Incidentally he was found to have filling defects of the CT abdomen and pelvis with concern for PE; confirmed on CTA.  Patient was started on heparin during hospitalization and transition to Eliquis.  He completed 3 days of IV Rocephin for UTI and was not discharged home with any antibiotics.  Patient reports that when he went to the pharmacy to pick up his medications he was told it was going to be $500 and could not afford it so he did not pick it up.  He began having similar right-sided flank pain 1 day ago and decided to call EMS today as he did not have anything for the pain.  Patient also mentions that he has not had a bowel movement since being in the hospital.  Pt denies chest pain, shortness of breath, passing out, coughing up blood, leg swelling, nausea, vomiting, obstipation, urinary symptoms, or any other associated symptoms.   CT A/P 03/14 IMPRESSION: CT negative for acute intra-abdominal process.  Incidentally imaged filling defects of the right lower lobe and right middle lobe, which are favored to represent pulmonary emboli and associated right middle lobe/lower lobe consolidation. Further evaluation with a dedicated CTA chest PE protocol is indicated for confirmation. These results were discussed by telephone at the time of interpretation on 10/09/2020 at 2:56 pm with [facility],.  Borderline dilated small bowel and colon, without evidence of obstruction. This is nonspecific, potentially representing enteritis, slow transit of  bowel contents, in/or reflective of Constipation/obstipation.  CTA Chest 03/14 IMPRESSION: 1. Marked severity right-sided pulmonary embolism without evidence of saddle embolus or right heart strain. 2. Mild to moderate severity bilateral atelectasis and/or infiltrate. 3. Small hiatal hernia. 4. Emphysema.  The history is provided by the patient, medical records and the EMS personnel.       Past Medical History:  Diagnosis Date  . Hypertension     Patient Active Problem List   Diagnosis Date Noted  . Abdominal pain 10/14/2020  . Pulmonary embolism (HCC) 10/09/2020  . Acute lower UTI 10/09/2020  . Accelerated hypertension 10/09/2020  . Acute respiratory failure (HCC) 10/09/2020  . Acute cystitis with hematuria     No past surgical history on file.     No family history on file.  Social History   Tobacco Use  . Smoking status: Former Smoker    Quit date: 09/25/2020    Years since quitting: 0.0  . Smokeless tobacco: Never Used  Substance Use Topics  . Alcohol use: Not Currently  . Drug use: Not Currently    Home Medications Prior to Admission medications   Medication Sig Start Date End Date Taking? Authorizing Provider  albuterol (VENTOLIN HFA) 108 (90 Base) MCG/ACT inhaler Inhale 2 puffs into the lungs every 6 (six) hours as needed for wheezing or shortness of breath. 10/12/20  Yes Almon Hercules, MD  apixaban (ELIQUIS) 5 MG TABS tablet Take 1 tablet (5 mg total) by mouth 2 (two) times daily. Start after you finish the starter pack 11/11/20  Yes Candelaria Stagers  T, MD  APIXABAN (ELIQUIS) VTE STARTER PACK (10MG  AND 5MG ) Take as directed on package: start with two-5mg  tablets twice daily for 7 days. On day 8, switch to one-5mg  tablet twice daily. 10/12/20  Yes , MD  losartan (COZAAR) 50 MG tablet Take 1 tablet (50 mg total) by mouth daily. 10/12/20  Yes Almon Hercules, MD  predniSONE (DELTASONE) 20 MG tablet Take 2 tablets (40 mg total) by mouth daily with  breakfast for 2 days. 10/12/20 10/14/20 Yes 10/14/20, MD    Allergies    Patient has no known allergies.  Review of Systems   Review of Systems  Constitutional: Negative for chills and fever.  Respiratory: Negative for cough and shortness of breath.   Cardiovascular: Negative for chest pain.  Gastrointestinal: Positive for abdominal pain and constipation. Negative for diarrhea, nausea and vomiting.  Genitourinary: Positive for flank pain. Negative for dysuria and hematuria.  All other systems reviewed and are negative.   Physical Exam Updated Vital Signs BP 133/89   Pulse (!) 101   Temp 98.7 F (37.1 C)   SpO2 97%   Physical Exam Vitals and nursing note reviewed.  Constitutional:      Appearance: He is not ill-appearing or diaphoretic.  HENT:     Head: Normocephalic and atraumatic.  Eyes:     Conjunctiva/sclera: Conjunctivae normal.  Cardiovascular:     Rate and Rhythm: Regular rhythm. Tachycardia present.     Pulses: Normal pulses.  Pulmonary:     Effort: Pulmonary effort is normal.     Breath sounds: Normal breath sounds. No wheezing, rhonchi or rales.     Comments: Speaking in full sentences without difficulty. Satting 97% on RA. LCTAB.  Abdominal:     Tenderness: There is abdominal tenderness. There is right CVA tenderness. There is no guarding or rebound.     Comments: Soft, slightly decreased BS throughout, + right flank and right CVA TTP, no r/g/r, neg murphy's, neg mcburney's  Musculoskeletal:     Cervical back: Neck supple.  Skin:    General: Skin is warm and dry.  Neurological:     Mental Status: He is alert.     ED Results / Procedures / Treatments   Labs (all labs ordered are listed, but only abnormal results are displayed) Labs Reviewed  COMPREHENSIVE METABOLIC PANEL - Abnormal; Notable for the following components:      Result Value   Glucose, Bld 118 (*)    All other components within normal limits  CBC WITH DIFFERENTIAL/PLATELET -  Abnormal; Notable for the following components:   Abs Immature Granulocytes 0.10 (*)    All other components within normal limits  SARS CORONAVIRUS 2 (TAT 6-24 HRS)  LIPASE, BLOOD  URINALYSIS, ROUTINE W REFLEX MICROSCOPIC  PROCALCITONIN  TROPONIN I (HIGH SENSITIVITY)  TROPONIN I (HIGH SENSITIVITY)    EKG EKG Interpretation  Date/Time:  Saturday October 14 2020 09:42:14 EDT Ventricular Rate:  93 PR Interval:    QRS Duration: 91 QT Interval:  343 QTC Calculation: 427 R Axis:   62 Text Interpretation: Sinus rhythm Ventricular premature complex Anteroseptal infarct, age indeterminate axis normal no ischemia Confirmed by Wednesday ([id number]) on 10/14/2020 9:46:15 AM   Radiology CT Abdomen Pelvis W Contrast  Result Date: 10/14/2020 CLINICAL DATA:  Abdominal pain, constipation EXAM: CT ABDOMEN AND PELVIS WITH CONTRAST TECHNIQUE: Multidetector CT imaging of the abdomen and pelvis was performed using the standard protocol following bolus administration of intravenous contrast. CONTRAST:  10/16/2020  OMNIPAQUE IOHEXOL 300 MG/ML  SOLN COMPARISON:  CT abdomen pelvis dated 10/09/2020. FINDINGS: Lower chest: Moderate bilateral ground-glass opacities, atelectasis, and consolidation are increased since 10/09/2020. There is a small right pleural effusion which appears new. The patient has known pulmonary embolism is redemonstrated, however not well characterized on this exam. A lipoma in the subcutaneous fat of the upper left back is partially imaged and measures at least 10.5 cm in the transverse dimension. Hepatobiliary: Multiple subcentimeter hepatic cysts are noted. No gallstones, gallbladder wall thickening, or biliary dilatation. Pancreas: Unremarkable. No pancreatic ductal dilatation or surrounding inflammatory changes. Spleen: Normal in size without focal abnormality. Adrenals/Urinary Tract: Adrenal glands are unremarkable. Other than bilateral renal cysts measuring up to 11 mm on the right, the kidneys are  normal, without renal calculi or hydronephrosis. Bladder is unremarkable. Stomach/Bowel: Stomach is within normal limits. Appendix appears normal. The degree of gaseous distension of the small bowel and colon appears unchanged. No evidence of bowel wall thickening or inflammatory changes. Vascular/Lymphatic: No significant vascular findings are present in the abdomen or pelvis. No enlarged abdominal or pelvic lymph nodes. Reproductive: Prostate is unremarkable. Other: Bilateral fat containing inguinal hernias are noted. A right-sided hydrocele is partially imaged. A fat containing paraumbilical hernia is noted. No significant abdominopelvic ascites is seen. Musculoskeletal: Degenerative changes are seen in the spine. 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 IMPRESSION: 1. Moderate bilateral ground-glass opacities, atelectasis, and consolidation are increased since 10/09/2020. New small right pleural effusion. These findings could reflect pneumonia, however given the patient's pulmonary embolism, pulmonary infarction is a consideration. 2. No acute process in the abdomen or pelvis. Electronically Signed   By: Romona Curls M.D.   On: 10/14/2020 11:49    Procedures Procedures   Medications Ordered in ED Medications  cefTRIAXone (ROCEPHIN) 1 g in sodium chloride 0.9 % 100 mL IVPB (1 g Intravenous New Bag/Given 10/14/20 1218)  azithromycin (ZITHROMAX) 500 mg in sodium chloride 0.9 % 250 mL IVPB (500 mg Intravenous New Bag/Given 10/14/20 1223)  morphine 4 MG/ML injection 4 mg (4 mg Intravenous Given 10/14/20 0955)  sodium chloride 0.9 % bolus 1,000 mL (0 mLs Intravenous Stopped 10/14/20 1044)  iohexol (OMNIPAQUE) 300 MG/ML solution 100 mL (100 mLs Intravenous Contrast Given 10/14/20 1048)  HYDROmorphone (DILAUDID) injection 1 mg (1 mg Intravenous Given 10/14/20 1140)    ED Course  I have reviewed the triage vital signs and the nursing notes.  Pertinent labs & imaging results that were available  during my care of the patient were reviewed by me and considered in my medical decision making (see chart for details).    MDM Rules/Calculators/A&P                          70 year old male who presents to the ED today with complaint of right-sided flank pain for the past day.  Incidentally he was just discharged from the hospital from 3/14-3/17 after presenting for right-sided flank pain found to have a UTI as well as an incidental finding of a right-sided PE and started on heparin, discharged with Eliquis.  Unfortunately not able to afford his medications and has not been on Eliquis for the past 2 days.  Does.  He was treated with IV Rocephin for 3 days and not discharged with an antibiotic for UTI.  His main complaint is flank pain as well as constipation.  Still passing gas.  On arrival to the ED patient is mildly tachycardic at 101.  Remainder of vitals are stable and patient is afebrile.  He is satting 97% on room air.  Denies any chest pain or shortness of breath.  We will plan to have transition of care assist with medication cost at this time as patient needs to be on Eliquis or other anticoagulant given his finding of PE.  Given he just had a CTA without any signs of respiratory distress at this time I do not feel he needs repeat CTA.  On exam patient does have a abdomen with decreased bowel sounds as well as right flank tenderness palpation right CVA tenderness palpation.  Given he is having constipation with the symptoms we will plan for CT abdomen pelvis to further assess for possible concern for SBO.  Will obtain lab work including CBC, CMP, lipase, urinalysis.  Will add on troponin at this time given history of PE recently, does not appear he had findings of right heart strain during his previous hospitalization. Discussed case with attending physician Dr. Delford FieldWright who agrees with plan.   CBC without leukocytosis. Hgb stable at 13.1.  CMP without electrolyte abnormalities. LFTs unremarkable.   Lipase 23 U/A negative for infection   Troponin 4  CT: IMPRESSION:  1. Moderate bilateral ground-glass opacities, atelectasis, and  consolidation are increased since 10/09/2020. New small right  pleural effusion. These findings could reflect pneumonia, however  given the patient's pulmonary embolism, pulmonary infarction is a  consideration.  2. No acute process in the abdomen or pelvis.   On reevaluation pt's O2 sats are down to 90-91%. Have placed on 2 L for comfort. Will add on CXR at this time however will cover for CAP. Will admit to medicine given right sided pain with concern for penumonia vs pulmonary infarct.   Discussed case with [facility] Dr. Ronaldo MiyamotoKyle who agrees to evaluate patient for admission. Appreciate his involvement. Recommends starting pt back on his Eliquis and to obtain procalcitonin level.   This note was prepared using Dragon voice recognition software and may include unintentional dictation errors due to the inherent limitations of voice recognition software.  Final Clinical Impression(s) / ED Diagnoses Final diagnoses:  Other pulmonary embolism without acute cor pulmonale, unspecified chronicity (HCC)  Pulmonary infarct [facility](HCC)  Community acquired pneumonia, unspecified laterality    Rx / DC Orders ED Discharge Orders    None       Tanda RockersVenter, Stefannie Defeo, PA-C 10/14/20 1229    Koleen DistanceWright, Anna G, MD 10/14/20 1429

## 2020-10-14 NOTE — ED Triage Notes (Signed)
Patient BIBA from home c/o abdominal pain + R flank pain. Was discharged a few days ago from [facility] progressive/urology unit. He states he was prescribed medications upon discharge but could not afford to get them filled. Since his release he states his symptoms have worsened. EMS reports abdomen is rigid and distended.   BP 142/101 HR 88 95% RA

## 2020-10-14 NOTE — Progress Notes (Addendum)
ANTICOAGULATION CONSULT NOTE  Pharmacy Consult for Eliquis Indication: pulmonary embolus  No Known Allergies  Patient Measurements:   Heparin Dosing Weight: TBW  Vital Signs: Temp: 98.7 F (37.1 C) (03/19 0902) BP: 133/89 (03/19 0902) Pulse Rate: 101 (03/19 0902)  Labs: Recent Labs    10/12/20 0427 10/14/20 0908 10/14/20 1126  HGB  --  13.1  --   HCT  --  39.2  --   PLT  --  266  --   CREATININE 1.05 0.95  --   TROPONINIHS  --  4 3    Estimated Creatinine Clearance: 90.4 mL/min (by C-G formula based on SCr of 0.95 mg/dL).  Assessment: 50 yoM admitted on 3/14 with abdominal pain and CT abdomen incidentally shows RLL and RML filling defects which may represent PE.  CTa + R-sided PE without evidence of R heart strain.  Baseline CBC & coags WNL.  No known use of prior to admission anticoagulation. He was on heparin drip 3/14> 3/16 and was transitioned to Eliquis on 3/16 I personally spent > 30 minutes educating him on Eliquis on 3/17 and gave him the 30 day free card to use at [facility] & confirmed that [facility] could fill his Rx.  I also wrote down address to [facility] & showed him the location on my cell phone. He couldn't find [facility] & went to [facility] where he was told it would be > $500 for Eliquis.  He did not give them the 30 day free card I had given him.  Now he is back in the ED w/ abdominal & R flank pain.   Last dose Eliquis was here at [facility] on 3/17 @ 10 am. See SW note 3/19: now [facility] has applied 30 day free card & Eliquis is $0 copay for starter pack.   Goal of Therapy:  Heparin level 0.3-0.7 units/ml Monitor platelets by anticoagulation protocol: Yes   Plan:  Eliquis 10 mg po BID x 7 days followed by Eliquis 5 mg po bid  SW has arranged w/ [facility] on [address] for 30 day free starter pack.  Pt is to follow up with [facility].  Eliquis will be $10/month at [facility] until pt assistance completed then will be free x 1 year @ [facility] w/ [facility]  PCP  Herby Abraham, Pharm.D 10/14/2020 1:52 PM

## 2020-10-14 NOTE — Plan of Care (Signed)
  Problem: Education: Goal: Knowledge of disease or condition will improve Outcome: Not Progressing Goal: Knowledge of the prescribed therapeutic regimen will improve Outcome: Not Progressing Goal: Individualized Educational Video(s) Outcome: Not Progressing   Problem: Activity: Goal: Ability to tolerate increased activity will improve Outcome: Not Progressing Goal: Will verbalize the importance of balancing activity with adequate rest periods Outcome: Not Progressing   Problem: Respiratory: Goal: Ability to maintain a clear airway will improve Outcome: Not Progressing Goal: Levels of oxygenation will improve Outcome: Not Progressing Goal: Ability to maintain adequate ventilation will improve Outcome: Not Progressing   Problem: Education: Goal: Knowledge of General Education information will improve Description: Including pain rating scale, medication(s)/side effects and non-pharmacologic comfort measures Outcome: Not Progressing   Problem: Health Behavior/Discharge Planning: Goal: Ability to manage health-related needs will improve Outcome: Not Progressing   Problem: Clinical Measurements: Goal: Ability to maintain clinical measurements within normal limits will improve Outcome: Not Progressing Goal: Will remain free from infection Outcome: Not Progressing Goal: Diagnostic test results will improve Outcome: Not Progressing Goal: Respiratory complications will improve Outcome: Not Progressing Goal: Cardiovascular complication will be avoided Outcome: Not Progressing   Problem: Activity: Goal: Risk for activity intolerance will decrease Outcome: Not Progressing   Problem: Nutrition: Goal: Adequate nutrition will be maintained Outcome: Not Progressing   Problem: Coping: Goal: Level of anxiety will decrease Outcome: Not Progressing   Problem: Elimination: Goal: Will not experience complications related to bowel motility Outcome: Not Progressing Goal: Will not  experience complications related to urinary retention Outcome: Not Progressing   Problem: Pain Managment: Goal: General experience of comfort will improve Outcome: Not Progressing   Problem: Safety: Goal: Ability to remain free from injury will improve Outcome: Not Progressing   Problem: Skin Integrity: Goal: Risk for impaired skin integrity will decrease Outcome: Not Progressing   

## 2020-10-14 NOTE — ED Notes (Signed)
Bladder scan shows 0ml °

## 2020-10-14 NOTE — H&P (Signed)
History and Physical    Dhruvan Gullion MWN:[id number] DOB: 11/12/1950 DOA: 10/14/2020  PCP: Patient, No Pcp Per  Patient coming from: Home  Chief Complaint: abdominal pain  HPI: Dalton Parrish is a 70 y.o. male with medical history significant of PE, tobacco abuse, HTN. Presenting with right side abdominal pain. Presentation for same a few days ago. He was discharged to home 2 days ago with a diagnosis of COPD exacerbation, acute right side PE and UTI. He was unable to pick up the medicines that were prescribed to him. He states that the pain in his abdomen returned in a similar fashion as to the pain that brought him for his previous visit. It was sharp and worsened with breathing. When he found that his symptoms return, he became concerned and returned to the ED. He denies any other aggravating or alleviating factors.    ED Course: CT ab/pelvis show b/l ground glass opacities concerning for PNA. Labwork was otherwise normal. He was started back on eliquis and started on CAP coverage. TRH was called for admission.   Review of Systems:  Denies CP, palpitations, N/V/D, syncopal episodes. Review of systems is otherwise negative for all not mentioned in HPI.   PMHx Past Medical History:  Diagnosis Date  . Hypertension     PSHx No past surgical history on file.  SocHx  reports that he quit smoking about 2 weeks ago. He has never used smokeless tobacco. He reports previous alcohol use. He reports previous drug use.  No Known Allergies  FamHx No family history on file.  Prior to Admission medications   Medication Sig Start Date End Date Taking? Authorizing Provider  albuterol (VENTOLIN HFA) 108 (90 Base) MCG/ACT inhaler Inhale 2 puffs into the lungs every 6 (six) hours as needed for wheezing or shortness of breath. 10/12/20  Yes Almon Hercules, MD  apixaban (ELIQUIS) 5 MG TABS tablet Take 1 tablet (5 mg total) by mouth 2 (two) times daily. Start after you finish the starter pack 11/11/20   Yes Almon Hercules, MD  APIXABAN (ELIQUIS) VTE STARTER PACK (10MG  AND 5MG ) Take as directed on package: start with two-5mg  tablets twice daily for 7 days. On day 8, switch to one-5mg  tablet twice daily. 10/12/20  Yes , MD  losartan (COZAAR) 50 MG tablet Take 1 tablet (50 mg total) by mouth daily. 10/12/20  Yes Almon Hercules, MD  predniSONE (DELTASONE) 20 MG tablet Take 2 tablets (40 mg total) by mouth daily with breakfast for 2 days. 10/12/20 10/14/20 Yes 10/14/20, MD    Physical Exam: Vitals:   10/14/20 0902  BP: 133/89  Pulse: (!) 101  Temp: 98.7 F (37.1 C)  SpO2: 97%    General: 70 y.o. male resting in bed in NAD Eyes: PERRL, normal sclera ENMT: Nares patent w/o discharge, orophaynx clear, dentition normal, ears w/o discharge/lesions/ulcers Neck: Supple, trachea midline Cardiovascular: RRR, +S1, S2, no m/g/r, equal pulses throughout Respiratory: diffuse wheeze, no r/r, slight increased WOB GI: BS+, ND, right flank tenderness, no masses noted, no organomegaly noted MSK: No e/c/c Skin: No rashes, bruises, ulcerations noted Neuro: A&O x 3, no focal deficits Psyc: Appropriate interaction and affect, calm/cooperative  Labs on Admission: I have personally reviewed following labs and imaging studies  CBC: Recent Labs  Lab 10/09/20 1235 10/10/20 0752 10/11/20 0651 10/14/20 0908  WBC 10.7* 10.6* 17.0* 9.4  NEUTROABS  --   --   --  7.1  HGB 13.5 11.9* 11.5* 13.1  HCT 40.0 35.7* 34.1* 39.2  MCV 84.4 85.2 84.4 84.1  PLT 223 171 177 266   Basic Metabolic Panel: Recent Labs  Lab 10/09/20 1235 10/10/20 0752 10/11/20 0651 10/12/20 0427 10/14/20 0908  NA 140 137 140 141 140  K 4.0 3.8 4.0 3.8 3.6  CL 105 103 106 106 108  CO2 22 22 25 24 22   GLUCOSE 150* 178* 128* 106* 118*  BUN 16 17 23 22 18   CREATININE 1.16 1.09 1.03 1.05 0.95  CALCIUM 9.6 9.2 9.5 9.3 9.6   GFR: Estimated Creatinine Clearance: 90.4 mL/min (by C-G formula based on SCr of 0.95  mg/dL). Liver Function Tests: Recent Labs  Lab 10/09/20 1235 10/14/20 0908  AST 18 16  ALT 16 27  ALKPHOS 79 73  BILITOT 1.8* 1.1  PROT 9.0* 7.9  ALBUMIN 4.6 4.1   Recent Labs  Lab 10/09/20 1235 10/14/20 0908  LIPASE 22 23   No results for input(s): AMMONIA in the last 168 hours. Coagulation Profile: Recent Labs  Lab 10/09/20 1530  INR 1.2   Cardiac Enzymes: No results for input(s): CKTOTAL, CKMB, CKMBINDEX, TROPONINI in the last 168 hours. BNP (last 3 results) No results for input(s): PROBNP in the last 8760 hours. HbA1C: No results for input(s): HGBA1C in the last 72 hours. CBG: No results for input(s): GLUCAP in the last 168 hours. Lipid Profile: No results for input(s): CHOL, HDL, LDLCALC, TRIG, CHOLHDL, LDLDIRECT in the last 72 hours. Thyroid Function Tests: No results for input(s): TSH, T4TOTAL, FREET4, T3FREE, THYROIDAB in the last 72 hours. Anemia Panel: No results for input(s): VITAMINB12, FOLATE, FERRITIN, TIBC, IRON, RETICCTPCT in the last 72 hours. Urine analysis:    Component Value Date/Time   COLORURINE YELLOW 10/14/2020 0909   APPEARANCEUR CLEAR 10/14/2020 0909   LABSPEC 1.013 10/14/2020 0909   PHURINE 5.0 10/14/2020 0909   GLUCOSEU NEGATIVE 10/14/2020 0909   HGBUR NEGATIVE 10/14/2020 0909   BILIRUBINUR NEGATIVE 10/14/2020 0909   KETONESUR NEGATIVE 10/14/2020 0909   PROTEINUR NEGATIVE 10/14/2020 0909   NITRITE NEGATIVE 10/14/2020 0909   LEUKOCYTESUR NEGATIVE 10/14/2020 0909    Radiological Exams on Admission: CT Abdomen Pelvis W Contrast  Result Date: 10/14/2020 CLINICAL DATA:  Abdominal pain, constipation EXAM: CT ABDOMEN AND PELVIS WITH CONTRAST TECHNIQUE: Multidetector CT imaging of the abdomen and pelvis was performed using the standard protocol following bolus administration of intravenous contrast. CONTRAST:  10/16/2020 OMNIPAQUE IOHEXOL 300 MG/ML  SOLN COMPARISON:  CT abdomen pelvis dated 10/09/2020. FINDINGS: Lower chest: Moderate bilateral  ground-glass opacities, atelectasis, and consolidation are increased since 10/09/2020. There is a small right pleural effusion which appears new. The patient has known pulmonary embolism is redemonstrated, however not well characterized on this exam. A lipoma in the subcutaneous fat of the upper left back is partially imaged and measures at least 10.5 cm in the transverse dimension. Hepatobiliary: Multiple subcentimeter hepatic cysts are noted. No gallstones, gallbladder wall thickening, or biliary dilatation. Pancreas: Unremarkable. No pancreatic ductal dilatation or surrounding inflammatory changes. Spleen: Normal in size without focal abnormality. Adrenals/Urinary Tract: Adrenal glands are unremarkable. Other than bilateral renal cysts measuring up to 11 mm on the right, the kidneys are normal, without renal calculi or hydronephrosis. Bladder is unremarkable. Stomach/Bowel: Stomach is within normal limits. Appendix appears normal. The degree of gaseous distension of the small bowel and colon appears unchanged. No evidence of bowel wall thickening or inflammatory changes. Vascular/Lymphatic: No significant vascular findings are present in the abdomen or pelvis. No enlarged abdominal or  pelvic lymph nodes. Reproductive: Prostate is unremarkable. Other: Bilateral fat containing inguinal hernias are noted. A right-sided hydrocele is partially imaged. A fat containing paraumbilical hernia is noted. No significant abdominopelvic ascites is seen. Musculoskeletal: Degenerative changes are seen in the spine. 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 IMPRESSION: 1. Moderate bilateral ground-glass opacities, atelectasis, and consolidation are increased since 10/09/2020. New small right pleural effusion. These findings could reflect pneumonia, however given the patient's pulmonary embolism, pulmonary infarction is a consideration. 2. No acute process in the abdomen or pelvis. Electronically Signed   By: Romona Curls  M.D.   On: 10/14/2020 11:49   DG Chest Port 1 View  Result Date: 10/14/2020 CLINICAL DATA:  Shortness of breath and right side abdominal pain. EXAM: PORTABLE CHEST 1 VIEW COMPARISON:  CT abdomen and pelvis today. FINDINGS: Small right pleural effusion and right greater than left basilar airspace disease are identified as seen on CT. No pneumothorax. Heart size is upper normal. No acute or focal bony abnormality. IMPRESSION: Small right pleural effusion and right greater than left basilar airspace disease which could be due to atelectasis or pneumonia. Electronically Signed   By: Drusilla Kanner M.D.   On: 10/14/2020 12:40    EKG: Independently reviewed. Sinus, no st elevation  Assessment/Plan Right side PE     - place in obs, tele     - continue eliquis     - TOC consult to assist with medi acquisition  CAP Possible COPD exacerbation     - continue rocephin/zithro for now     - check procal     - COVID 19 pending     - add combivent, steroids  HTN     - continue home losartan  DVT prophylaxis: Eliquis  Code Status: FULL  Family Communication: None at bedside  Consults called: None   Status is: Observation  The patient remains OBS appropriate and will d/c before 2 midnights.  Dispo: The patient is from: Home              Anticipated d/c is to: Home              Patient currently is not medically stable to d/c.   Difficult to place patient No  Teddy Spike DO [facility]  If 7PM-7AM, please contact night-coverage [email or web address]  10/14/2020, 1:06 PM

## 2020-10-14 NOTE — TOC Initial Note (Signed)
Transition of Care [facility]) - Initial/Assessment Note    Patient Details  Name: Dalton Parrish MRN: [id number] Date of Birth: 14-Jun-1951  [facility] CM/SW Contact:    Joanne Chars, LCSW Phone Number: 10/14/2020, 10:33 AM  Clinical Narrative:     CSW met with pt regarding difficulties getting his prescriptions filled, reports he was told it would cost more than $500.  Pt reports his pharmacy is [facility] on [address].  CSW spoke with [facility] on [address], they did not have an elequis discount card applied to pt order--they were able to do this reducing that cost to $0.  Other scripts as follows: Albuterol: $26, Losartin: $48, Prednisone: $3.10.  Good Rx coupon lowered losartin cost to $26.    CSW met with pt and discussed the above, provided Good Rx coupon for Losartin.  Pt said he can afford $50-60 as the cost now stands.  CSW discussed with pt need to contact medicare about signing up for part B and D and pt verbalizes understanding.  Also discussed importance of following up at [facility] appt on 4/21 so that they can assist with med costs moving forward as current supply is only 30 days.  CSW attempted to contact sister in law [place] but no answer.  Per pt, Mariann Laster working today until [employer] and will pick up after work.  CSW left phone number for Mariann Laster to call with any questions.                 Barriers to Discharge: No Barriers Identified   Patient Goals and CMS Choice        Expected Discharge Plan and Services                                                Prior Living Arrangements/Services                       Activities of Daily Living      Permission Sought/Granted                  Emotional Assessment              Admission diagnosis:  abd pain, back pain Patient Active Problem List   Diagnosis Date Noted  . Pulmonary embolism ([place]) 10/09/2020  . Acute lower UTI 10/09/2020  . Accelerated  hypertension 10/09/2020  . Acute respiratory failure ([place]) 10/09/2020  . Acute cystitis with hematuria    PCP:  Patient, No Pcp Per Pharmacy:   [facility] [address], [place] [address] Phone: [phone] Fax: [place], [place] [address] Phone: [phone] Fax: [place], [address] [place] [address] Phone: [phone] Fax: [phone]     Social Determinants of Health (SDOH) Interventions    Readmission Risk Interventions No flowsheet data found.

## 2020-10-15 DIAGNOSIS — J449 Chronic obstructive pulmonary disease, unspecified: Secondary | ICD-10-CM

## 2020-10-15 DIAGNOSIS — I2699 Other pulmonary embolism without acute cor pulmonale: Secondary | ICD-10-CM

## 2020-10-15 DIAGNOSIS — R109 Unspecified abdominal pain: Secondary | ICD-10-CM

## 2020-10-15 LAB — COMPREHENSIVE METABOLIC PANEL
ALT: 23 U/L (ref 0–44)
AST: 12 U/L — ABNORMAL LOW (ref 15–41)
Albumin: 3.3 g/dL — ABNORMAL LOW (ref 3.5–5.0)
Alkaline Phosphatase: 57 U/L (ref 38–126)
Anion gap: 9 (ref 5–15)
BUN: 13 mg/dL (ref 8–23)
CO2: 23 mmol/L (ref 22–32)
Calcium: 8.9 mg/dL (ref 8.9–10.3)
Chloride: 105 mmol/L (ref 98–111)
Creatinine, Ser: 1 mg/dL (ref 0.61–1.24)
GFR, Estimated: >60 mL/min (ref >60)
Glucose, Bld: 135 mg/dL — ABNORMAL HIGH (ref 70–99)
Potassium: 4.3 mmol/L (ref 3.5–5.1)
Sodium: 137 mmol/L (ref 135–145)
Total Bilirubin: 0.7 mg/dL (ref 0.3–1.2)
Total Protein: 6.7 g/dL (ref 6.5–8.1)

## 2020-10-15 LAB — CBC
HCT: 34 % — ABNORMAL LOW (ref 39.0–52.0)
Hemoglobin: 11.2 g/dL — ABNORMAL LOW (ref 13.0–17.0)
MCH: 27.9 pg (ref 26.0–34.0)
MCHC: 32.9 g/dL (ref 30.0–36.0)
MCV: 84.8 fL (ref 80.0–100.0)
Platelets: 224 10*3/uL (ref 150–400)
RBC: 4.01 MIL/uL — ABNORMAL LOW (ref 4.22–5.81)
RDW: 14.3 % (ref 11.5–15.5)
WBC: 10 10*3/uL (ref 4.0–10.5)
nRBC: 0 % (ref 0.0–0.2)

## 2020-10-15 MED ORDER — PREDNISONE 20 MG PO TABS: 40.0000 mg | ORAL_TABLET | Freq: Every day | ORAL | 0 refills | Status: DC

## 2020-10-15 MED ORDER — OXYCODONE HCL 5 MG PO TABS: 5.0000 mg | ORAL_TABLET | ORAL | 0 refills | Status: AC | PRN

## 2020-10-15 MED ORDER — OXYCODONE HCL 5 MG PO TABS: 5.0000 mg | ORAL_TABLET | ORAL | 0 refills | Status: DC | PRN

## 2020-10-15 MED ORDER — PREDNISONE 20 MG PO TABS: 40.0000 mg | ORAL_TABLET | Freq: Every day | ORAL | 0 refills | Status: AC

## 2020-10-15 MED ORDER — APIXABAN 5 MG PO TABS: ORAL_TABLET | ORAL | 0 refills | Status: AC

## 2020-10-15 NOTE — TOC Transition Note (Signed)
Transition of Care [facility]) - CM/SW Discharge Note   Patient Details  Name: Dalton Parrish MRN: [id number] Date of Birth: 29-Sep-1950  Transition of Care [facility]) CM/SW Contact:  Elliot Cousin, RN Phone Number: [phone] 10/15/2020, 12:39 PM   Clinical Narrative:     TOC CM spoke to pt and sister, Dalton Parrish. All meds sent to [facility] on [address]. They have Eliquis in stock, called in the 30 day free trial coupon for Eliquis to [facility].   Final next level of care: Home/Self Care Barriers to Discharge: No Barriers Identified   Patient Goals and CMS Choice  Discharge Placement  Discharge Plan and Services  Social Determinants of Health (SDOH) Interventions     Readmission Risk Interventions No flowsheet data found.

## 2020-10-15 NOTE — Discharge Summary (Signed)
Physician Discharge Summary  [name] BMW:[id number] DOB: [birth year] DOA: 10/14/2020  PCP: Patient, No Pcp Per  Admit date: 10/14/2020 Discharge date: 10/15/2020  Admitted From: Home Disposition: Home  Recommendations for Outpatient Follow-up:  1. Follow up with PCP in 1-2 weeks 2. Encourage patient to pick up his Eliquis for recent diagnosis of pulmonary embolism 3. Tinea prednisone 40 mg p.o. daily x4 additional days.  Home Health: No Equipment/Devices: None  Discharge Condition: Stable  CODE STATUS: Full code Diet recommendation: Heart healthy diet  History of present illness:  Dalton Parrish is a 70 year old male with past medical history significant for recent diagnosis of pulmonary embolism, tobacco use disorder, essential hypertension who presented to the emergency department with complaint of right-sided abdominal/chest discomfort.  Same presentation a few days ago in which she was diagnosed with COPD exacerbation and acute right-sided pulmonary embolism.  Patient states he was unable to pick up his medications that were prescribed to him due to cost.  Pain is described as a sharp pain localized to the right chest wall area that is worse with deep breaths.  No other complaints or concerns at this time.  In the ED, temperature 98.7 F, HR 101, RR 22, BP 133/89, SPO2 97% on room air.  Sodium 140, potassium 3.6, chloride 108, CO2 22, glucose 118, BUN 18, creatinine 0.95, AST 16, ALT 27, total bilirubin 1.1.  High-sensitivity troponin 4>3.  WBC 9.4, hemoglobin 13.1, platelets 266.  Urinalysis unrevealing.  Chest x-ray with small right pleural effusion and right greater than left basilar airspace disease likely secondary to atelectasis.  CT abdomen/pelvis with contrast with moderate bilateral groundglass opacities, atelectasis, small right pleural effusion, consolidation likely secondary to pulmonary infarction and no acute process in the abdomen/pelvis.  Patient was started back on  Eliquis and started on antimicrobial coverage for concern of pneumonia.  Hospital service consulted for further evaluation and management.   Hospital course:  Acute right-sided pulmonary embolism Patient recently discharged on 10/12/2020 with new finding of right-sided pulmonary embolism.  Lower extremity ultrasound during that admission negative for DVT.  Patient was discharged on Eliquis, but unfortunately did not pick up his medication due to cost and not receiving discount card.  Was seen by social work and discussed with his pharmacy which should have no cost.  Patient encouraged to continue his Eliquis and pick up his prescription as previously prescribed.  There is initial concern for possible commune acquired pneumonia, patient afebrile without leukocytosis and procalcitonin within normal limits.  CT abdomen/pelvis findings of consolidation most likely related to atelectasis and pulmonary infarction on the right side.  Antibiotics were discontinued.  Patient will need age-appropriate cancer screenings such as colonoscopy outpatient.  Follow-up has been arranged with the [facility].  COPD exacerbation Has been titrated off of submental oxygen.  Continue prednisone 40 mg p.o. daily to complete 5-day course.  Albuterol MDI as needed.  Tobacco use disorder Continue to encourage tobacco cessation.  Patient reports quit smoking about 1 month ago.  Essential hypertension Continue losartan 50 mg p.o. daily.  [facility]patient follow-up with [facility].  Discharge Diagnoses:  Active Problems:   Other pulmonary embolism without acute cor pulmonale (HCC)   Pulmonary infarct [facility])    Discharge Instructions  Discharge Instructions    Call MD for:  difficulty breathing, headache or visual disturbances   Complete by: As directed    Call MD for:  extreme fatigue   Complete by: As directed    Call  MD for:  persistant dizziness or light-headedness   Complete by:  As directed    Call MD for:  persistant nausea and vomiting   Complete by: As directed    Call MD for:  severe uncontrolled pain   Complete by: As directed    Call MD for:  temperature >100.4   Complete by: As directed    Diet - low sodium heart healthy   Complete by: As directed    Increase activity slowly   Complete by: As directed      Allergies as of 10/15/2020   No Known Allergies     Medication List    TAKE these medications   albuterol 108 (90 Base) MCG/ACT inhaler Commonly known as: VENTOLIN HFA Inhale 2 puffs into the lungs every 6 (six) hours as needed for wheezing or shortness of breath.   Apixaban Starter Pack (10mg  and 5mg ) Commonly known as: ELIQUIS STARTER PACK Take as directed on package: start with two-5mg  tablets twice daily for 7 days. On day 8, switch to one-5mg  tablet twice daily.   apixaban 5 MG Tabs tablet Commonly known as: ELIQUIS Take 1 tablet (5 mg total) by mouth 2 (two) times daily. Start after you finish the starter pack Start taking on: November 11, 2020   losartan 50 MG tablet Commonly known as: COZAAR Take 1 tablet (50 mg total) by mouth daily.   oxyCODONE 5 MG immediate release tablet Commonly known as: Oxy IR/ROXICODONE Take 1 tablet (5 mg total) by mouth every 4 (four) hours as needed for up to 5 days for moderate pain.   predniSONE 20 MG tablet Commonly known as: DELTASONE Take 2 tablets (40 mg total) by mouth daily with breakfast for 4 days. Start taking on: October 16, 2020       No Known Allergies  Consultations:  None   Procedures/Studies: CT Angio Chest PE W and/or Wo Contrast  Result Date: 10/09/2020 CLINICAL DATA:  Shortness of breath and wheezing. EXAM: CT ANGIOGRAPHY CHEST WITH CONTRAST TECHNIQUE: Multidetector CT imaging of the chest was performed using the standard protocol during bolus administration of intravenous contrast. Multiplanar CT image reconstructions and MIPs were obtained to evaluate the vascular  anatomy. CONTRAST:  75mL OMNIPAQUE IOHEXOL 350 MG/ML SOLN COMPARISON:  None. FINDINGS: Cardiovascular: The thoracic aorta is normal in appearance. Marked severity intraluminal filling defects are seen involving the distal aspect of the right pulmonary artery. Extensive involvement of the middle lobe and lower lobe branches is seen. There is no evidence of saddle embolus. Normal heart size without evidence of right heart strain. No pericardial effusion. Mediastinum/Nodes: No enlarged mediastinal, hilar, or axillary lymph nodes. Thyroid gland, trachea, and esophagus demonstrate no significant findings. Lungs/Pleura: There is mild emphysematous lung disease. Mild areas of atelectasis and/or infiltrate are seen within the inferior aspect of the left upper lobe and posterior aspects of the right upper lobe, right middle lobe and left lung base. Moderate severity atelectasis and/or infiltrate is seen within the right lower lobe. There is no evidence of a pleural effusion or pneumothorax. Upper Abdomen: There is a small hiatal hernia. Musculoskeletal: A 10.5 cm x 3.2 cm subcutaneous lipoma is seen within the soft tissues of the back, to the left of midline. Degenerative changes seen throughout the thoracic spine. Review of the MIP images confirms the above findings. IMPRESSION: 1. Marked severity right-sided pulmonary embolism without evidence of saddle embolus or right heart strain. 2. Mild to moderate severity bilateral atelectasis and/or infiltrate. 3. Small hiatal hernia.  4. Emphysema. Emphysema (ICD10-J43.9). Electronically Signed   By: Aram Candela M.D.   On: 10/09/2020 16:02   CT Abdomen Pelvis W Contrast  Result Date: 10/14/2020 CLINICAL DATA:  Abdominal pain, constipation EXAM: CT ABDOMEN AND PELVIS WITH CONTRAST TECHNIQUE: Multidetector CT imaging of the abdomen and pelvis was performed using the standard protocol following bolus administration of intravenous contrast. CONTRAST:  OMNIPAQUE IOHEXOL  300 MG/ML  SOLN COMPARISON:  CT abdomen pelvis dated 10/09/2020. FINDINGS: Lower chest: Moderate bilateral ground-glass opacities, atelectasis, and consolidation are increased since 10/09/2020. There is a small right pleural effusion which appears new. The patient has known pulmonary embolism is redemonstrated, however not well characterized on this exam. A lipoma in the subcutaneous fat of the upper left back is partially imaged and measures at least 10.5 cm in the transverse dimension. Hepatobiliary: Multiple subcentimeter hepatic cysts are noted. No gallstones, gallbladder wall thickening, or biliary dilatation. Pancreas: Unremarkable. No pancreatic ductal dilatation or surrounding inflammatory changes. Spleen: Normal in size without focal abnormality. Adrenals/Urinary Tract: Adrenal glands are unremarkable. Other than bilateral renal cysts measuring up to 11 mm on the right, the kidneys are normal, without renal calculi or hydronephrosis. Bladder is unremarkable. Stomach/Bowel: Stomach is within normal limits. Appendix appears normal. The degree of gaseous distension of the small bowel and colon appears unchanged. No evidence of bowel wall thickening or inflammatory changes. Vascular/Lymphatic: No significant vascular findings are present in the abdomen or pelvis. No enlarged abdominal or pelvic lymph nodes. Reproductive: Prostate is unremarkable. Other: Bilateral fat containing inguinal hernias are noted. A right-sided hydrocele is partially imaged. A fat containing paraumbilical hernia is noted. No significant abdominopelvic ascites is seen. Musculoskeletal: Degenerative changes are seen in the spine. 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 IMPRESSION: 1. Moderate bilateral ground-glass opacities, atelectasis, and consolidation are increased since 10/09/2020. New small right pleural effusion. These findings could reflect pneumonia, however given the patient's pulmonary embolism, pulmonary infarction  is a consideration. 2. No acute process in the abdomen or pelvis. Electronically Signed   By: Romona Curls M.D.   On: 10/14/2020 11:49   CT ABDOMEN PELVIS W CONTRAST  Result Date: 10/09/2020 CLINICAL DATA:  70 year old male with abdominal infection suspected EXAM: CT ABDOMEN AND PELVIS WITH CONTRAST TECHNIQUE: Multidetector CT imaging of the abdomen and pelvis was performed using the standard protocol following bolus administration of intravenous contrast. CONTRAST:  OMNIPAQUE IOHEXOL 300 MG/ML  SOLN COMPARISON:  None. FINDINGS: Lower chest: Peripheral nodular opacities at the right costophrenic sulcus, within the right lower lobe. Partial volume loss of the right lower lobe and the right middle lobe. Questionable hypodensity linear filling defects within the pulmonary arteries of the right lower lobe. These are better appreciated on the coronal images. Hepatobiliary: Subcentimeter rounded hypodensity of the right liver dome, most likely a benign biliary cyst though nonspecific. Gallbladder is decompressed. No inflammatory changes at the gallbladder fossa. Pancreas: Unremarkable pancreas Spleen: Unremarkable spleen Adrenals/Urinary Tract: - Right adrenal gland:  Unremarkable - Left adrenal gland: Unremarkable. - Right kidney: No hydronephrosis, nephrolithiasis, inflammation, or ureteral dilation. Subcentimeter hypodense lesion at the anterior cortex of the right kidney, most likely a benign cyst though strictly too small to characterize. - Left Kidney: No hydronephrosis, nephrolithiasis, inflammation, or ureteral dilation. Subcentimeter hypodense lesion at the posterior cortex of the left kidney at the hilum, most likely benign cyst though strictly too small to characterize. - Urinary Bladder: Unremarkable. Stomach/Bowel: - Stomach: Stomach demonstrates some gaseous distension though otherwise unremarkable. Hiatal hernia. No  evidence of gastric outlet obstruction. - Small bowel: Gas filled small bowel  loops distally without transition point or wall thickening. No focal inflammatory changes or interloop fluid. - Appendix: Normal. - Colon: Formed stool within right colon sigmoid colon and rectum. Mild gaseous distension of the transverse colon. No focal wall thickening or focal inflammatory changes. Vascular/Lymphatic: Atherosclerotic changes of the abdominal aorta and the iliac arteries. Bilateral iliac arteries and proximal femoral arteries are patent. Reproductive: Unremarkable prostate Other: Bilateral fat containing inguinal hernia. Fat containing umbilical hernia without inflammatory changes or evidence of obstruction. Musculoskeletal: Degenerative changes of the visualized thoracolumbar spine. No acute displaced fracture. Vacuum disc phenomenon at L2-L3, L4-L5, L5-S1. IMPRESSION: CT negative for acute intra-abdominal process. Incidentally imaged filling defects of the right lower lobe and right middle lobe, which are favored to represent pulmonary emboli and associated right middle lobe/lower lobe consolidation. Further evaluation with a dedicated CTA chest PE protocol is indicated for confirmation. These results were discussed by telephone at the time of interpretation on 10/09/2020 at 2:56 pm with [facility],. Borderline dilated small bowel and colon, without evidence of obstruction. This is nonspecific, potentially representing enteritis, slow transit of bowel contents, in/or reflective of constipation/obstipation. Electronically Signed   By: Gilmer Mor D.O.   On: 10/09/2020 15:05   DG Chest Port 1 View  Result Date: 10/14/2020 CLINICAL DATA:  Shortness of breath and right side abdominal pain. EXAM: PORTABLE CHEST 1 VIEW COMPARISON:  CT abdomen and pelvis today. FINDINGS: Small right pleural effusion and right greater than left basilar airspace disease are identified as seen on CT. No pneumothorax. Heart size is upper normal. No acute or focal bony abnormality. IMPRESSION: Small right pleural  effusion and right greater than left basilar airspace disease which could be due to atelectasis or pneumonia. Electronically Signed   By: Drusilla Kanner M.D.   On: 10/14/2020 12:40   ECHOCARDIOGRAM COMPLETE  Result Date: 10/10/2020    ECHOCARDIOGRAM REPORT   Patient Name:   KWADWO TARAS Date of Exam: 10/10/2020 Medical Rec #:  [id number]     Height:       72.0 in Accession #:    [id number]    Weight:       210.0 lb Date of Birth:  07/06/1951    BSA:          2.175 m Patient Age:    69 years      BP:           134/86 mmHg Patient Gender: M             HR:           84 bpm. Exam Location:  Inpatient Procedure: 2D Echo, Cardiac Doppler and Color Doppler Indications:    Pulmonary embolus  History:        Patient has no prior history of Echocardiogram examinations.                 COPD, Signs/Symptoms:Chest Pain and Shortness of Breath; Risk                 Factors:Hypertension and Current Smoker.  Sonographer:    Lavenia Atlas Referring Phys: [phone] RIPUDEEP K RAI  Sonographer Comments: Suboptimal subcostal window. IMPRESSIONS  1. Left ventricular ejection fraction, by estimation, is 60 to 65%. The left ventricle has normal function. The left ventricle has no regional wall motion abnormalities. Left ventricular diastolic parameters were normal.  2. Right ventricular systolic function is normal. The right ventricular  size is normal. There is normal pulmonary artery systolic pressure.  3. The mitral valve is normal in structure. No evidence of mitral valve regurgitation. No evidence of mitral stenosis.  4. The aortic valve is normal in structure. Aortic valve regurgitation is not visualized. No aortic stenosis is present.  5. The inferior vena cava is normal in size with greater than 50% respiratory variability, suggesting right atrial pressure of 3 mmHg. FINDINGS  Left Ventricle: Left ventricular ejection fraction, by estimation, is 60 to 65%. The left ventricle has normal function. The left ventricle has no  regional wall motion abnormalities. The left ventricular internal cavity size was normal in size. There is  no left ventricular hypertrophy of the basal-septal segment. Left ventricular diastolic parameters were normal. Right Ventricle: The right ventricular size is normal. No increase in right ventricular wall thickness. Right ventricular systolic function is normal. There is normal pulmonary artery systolic pressure. The tricuspid regurgitant velocity is 2.21 m/s, and  with an assumed right atrial pressure of 3 mmHg, the estimated right ventricular systolic pressure is 22.5 mmHg. Left Atrium: Left atrial size was normal in size. Right Atrium: Right atrial size was normal in size. Pericardium: There is no evidence of pericardial effusion. Mitral Valve: The mitral valve is normal in structure. No evidence of mitral valve regurgitation. No evidence of mitral valve stenosis. Tricuspid Valve: The tricuspid valve is normal in structure. Tricuspid valve regurgitation is not demonstrated. No evidence of tricuspid stenosis. Aortic Valve: The aortic valve is normal in structure. Aortic valve regurgitation is not visualized. No aortic stenosis is present. Pulmonic Valve: The pulmonic valve was normal in structure. Pulmonic valve regurgitation is not visualized. No evidence of pulmonic stenosis. Aorta: The aortic root is normal in size and structure. Venous: The inferior vena cava is normal in size with greater than 50% respiratory variability, suggesting right atrial pressure of 3 mmHg. IAS/Shunts: No atrial level shunt detected by color flow Doppler.  LEFT VENTRICLE PLAX 2D LVIDd:         4.40 cm Diastology LVIDs:         2.80 cm LV e' medial:    9.03 cm/s LV PW:         1.10 cm LV E/e' medial:  8.8 LV IVS:        1.30 cm LV e' lateral:   10.20 cm/s                        LV E/e' lateral: 7.8  RIGHT VENTRICLE RV Basal diam:  2.60 cm RV S prime:     14.00 cm/s TAPSE (M-mode): 2.4 cm LEFT ATRIUM             Index       RIGHT  ATRIUM           Index LA diam:        3.30 cm 1.52 cm/m  RA Area:     12.60 cm LA Vol (A2C):   49.6 ml 22.80 ml/m RA Volume:   26.40 ml  12.14 ml/m LA Vol (A4C):   40.9 ml 18.80 ml/m LA Biplane Vol: 48.9 ml 22.48 ml/m  AORTIC VALVE LVOT Vmax:   135.00 cm/s LVOT Vmean:  100.000 cm/s LVOT VTI:    0.285 m  AORTA Ao Root diam: 3.30 cm MITRAL VALVE               TRICUSPID VALVE MV Area (PHT): 4.89 cm    TR  Peak grad:   19.5 mmHg MV Decel Time: 155 msec    TR Vmax:        221.00 cm/s MV E velocity: 79.70 cm/s MV A velocity: 97.30 cm/s  SHUNTS MV E/A ratio:  0.82        Systemic VTI: 0.29 m Rachelle Hora Croitoru MD Electronically signed by Thurmon Fair MD Signature Date/Time: 10/10/2020/3:22:59 PM    Final    VAS [place] LOWER EXTREMITY VENOUS (DVT)  Result Date: 10/10/2020  Lower Venous DVT Study Indications: Pulmonary embolism, and Pain when breathing.  Comparison Study: No previous study Performing Technologist: Ernestene Mention  Examination Guidelines: A complete evaluation includes B-mode imaging, spectral Doppler, color Doppler, and power Doppler as needed of all accessible portions of each vessel. Bilateral testing is considered an integral part of a complete examination. Limited examinations for reoccurring indications may be performed as noted. The reflux portion of the exam is performed with the patient in reverse Trendelenburg.  +---------+---------------+---------+-----------+----------+--------------+ RIGHT    CompressibilityPhasicitySpontaneityPropertiesThrombus Aging +---------+---------------+---------+-----------+----------+--------------+ CFV      Full           Yes      Yes                                 +---------+---------------+---------+-----------+----------+--------------+ SFJ      Full                                                        +---------+---------------+---------+-----------+----------+--------------+ FV Prox  Full           Yes      Yes                                  +---------+---------------+---------+-----------+----------+--------------+ FV Mid   Full           Yes      Yes                                 +---------+---------------+---------+-----------+----------+--------------+ FV DistalFull           Yes      Yes                                 +---------+---------------+---------+-----------+----------+--------------+ PFV      Full                                                        +---------+---------------+---------+-----------+----------+--------------+ POP      Full           Yes      Yes                                 +---------+---------------+---------+-----------+----------+--------------+ PTV      Full                                                        +---------+---------------+---------+-----------+----------+--------------+  PERO     Full                                                        +---------+---------------+---------+-----------+----------+--------------+   +---------+---------------+---------+-----------+----------+--------------+ LEFT     CompressibilityPhasicitySpontaneityPropertiesThrombus Aging +---------+---------------+---------+-----------+----------+--------------+ CFV      Full           Yes      Yes                                 +---------+---------------+---------+-----------+----------+--------------+ SFJ      Full                                                        +---------+---------------+---------+-----------+----------+--------------+ FV Prox  Full           Yes      Yes                                 +---------+---------------+---------+-----------+----------+--------------+ FV Mid   Full           Yes      Yes                                 +---------+---------------+---------+-----------+----------+--------------+ FV DistalFull           Yes      Yes                                  +---------+---------------+---------+-----------+----------+--------------+ PFV      Full                                                        +---------+---------------+---------+-----------+----------+--------------+ POP      Full           Yes      Yes                                 +---------+---------------+---------+-----------+----------+--------------+ PTV      Full                                                        +---------+---------------+---------+-----------+----------+--------------+ PERO     Full                                                        +---------+---------------+---------+-----------+----------+--------------+  Summary: BILATERAL: - No evidence of deep vein thrombosis seen in the lower extremities, bilaterally. - No evidence of superficial venous thrombosis in the lower extremities, bilaterally. -No evidence of popliteal cyst, bilaterally.   *See table(s) above for measurements and observations. Electronically signed by Waverly Ferrari MD on 10/10/2020 at 12:34:30 PM.    Final       Subjective: Patient seen and examined bedside, resting comfortably.  No specific complaints this morning.  Feels good and ready for discharge home.  Ambulated with nursing staff this morning with no hypoxic events during walk.  Patient was seen by social work yesterday in which arrangements made for him to obtain his medications at a reduced rate.  Discussed with patient needs continued cessation from tobacco and to ensure that he picks up his prescriptions; as this is the leading etiology to his recurrent hospitalization yesterday.  No other complaints or concerns at this time.  Denies headache, no fever/chills/night sweats, no nausea/vomiting/diarrhea, no chest pain, palpitations, no shortness of breath, no abdominal pain, no weakness, no fatigue, no paresthesias.  No acute events overnight per nursing staff.  Discharge Exam: Vitals:   10/15/20 0400  10/15/20 0830  BP: 124/79   Pulse:    Resp: 20   Temp: 98.2 F (36.8 C)   SpO2: 95% 93%   Vitals:   10/14/20 1921 10/14/20 2052 10/15/20 0400 10/15/20 0830  BP: 126/82 131/84 124/79   Pulse: 92 92    Resp: (!) Temp: 98.9 F (37.2 C) 98.9 F (37.2 C) 98.2 F (36.8 C)   TempSrc: Oral Oral Oral   SpO2: 94% 94% 95% 93%  Weight:      Height:        General: Pt is alert, awake, not in acute distress Cardiovascular: RRR, S1/S2 +, no rubs, no gallops Respiratory: CTA bilaterally, no wheezing, no rhonchi, on room air Abdominal: Soft, NT, ND, bowel sounds + Extremities: no edema, no cyanosis    The results of significant diagnostics from this hospitalization (including imaging, microbiology, ancillary and laboratory) are listed below for reference.     Microbiology: Recent Results (from the past 240 hour(s))  Urine culture     Status: Abnormal   Collection Time: 10/09/20  1:00 PM   Specimen: Urine, Random  Result Value Ref Range Status   Specimen Description   Final    URINE, RANDOM Performed at [facility], [address] [place], [place] [address]    Special Requests   Final    NONE Performed at [facility], [address], [place], [place] [address]    Culture >=100,000 COLONIES/mL ESCHERICHIA COLI (A)  Final   Report Status 10/12/2020 FINAL  Final   Organism ID, Bacteria ESCHERICHIA COLI (A)  Final      Susceptibility   Escherichia coli - MIC*    AMPICILLIN <=2 SENSITIVE Sensitive     CEFAZOLIN <=4 SENSITIVE Sensitive     CEFEPIME <=0.12 SENSITIVE Sensitive     CEFTRIAXONE <=0.25 SENSITIVE Sensitive     CIPROFLOXACIN <=0.25 SENSITIVE Sensitive     GENTAMICIN <=1 SENSITIVE Sensitive     IMIPENEM <=0.25 SENSITIVE Sensitive     NITROFURANTOIN <=16 SENSITIVE Sensitive     TRIMETH/SULFA <=20 SENSITIVE Sensitive     AMPICILLIN/SULBACTAM <=2 SENSITIVE Sensitive     PIP/TAZO <=4 SENSITIVE Sensitive     * >=100,000  COLONIES/mL ESCHERICHIA COLI  Blood culture (routine x 2)     Status: None  Collection Time: 10/09/20  3:25 PM   Specimen: BLOOD  Result Value Ref Range Status   Specimen Description   Final    BLOOD RIGHT ANTECUBITAL Performed at [facility], [address] [place], [place] [address]    Special Requests   Final    BOTTLES DRAWN AEROBIC AND ANAEROBIC Blood Culture results may not be optimal due to an inadequate volume of blood received in culture bottles Performed at [facility], [address] [place], [place] [address]    Culture   Final    NO GROWTH 5 DAYS Performed at [facility], [address] [place], [place] [address]    Report Status 10/14/2020 FINAL  Final  Resp Panel by RT-PCR (Flu A&B, Covid) Nasopharyngeal Swab     Status: None   Collection Time: 10/09/20  3:32 PM   Specimen: Nasopharyngeal Swab; Nasopharyngeal(NP) swabs in vial transport medium  Result Value Ref Range Status   SARS Coronavirus 2 by RT PCR NEGATIVE NEGATIVE Final    Comment: (NOTE) SARS-CoV-2 target nucleic acids are NOT DETECTED.  The SARS-CoV-2 RNA is generally detectable in upper respiratory specimens during the acute phase of infection. The lowest concentration of SARS-CoV-2 viral copies this assay can detect is 138 copies/mL. A negative result does not preclude SARS-Cov-2 infection and should not be used as the sole basis for treatment or other patient management decisions. A negative result may occur with  improper specimen collection/handling, submission of specimen other than nasopharyngeal swab, presence of viral mutation(s) within the areas targeted by this assay, and inadequate number of viral copies(<138 copies/mL). A negative result must be combined with clinical observations, patient history, and epidemiological information. The expected result is Negative.  Fact Sheet for Patients:  BloggerCourse.comhttps://www.fda.gov/media/152166/download  Fact  Sheet for Healthcare Providers:  SeriousBroker.ithttps://www.fda.gov/media/152162/download  This test is no t yet approved or cleared by the [place] FDA and  has been authorized for detection and/or diagnosis of SARS-CoV-2 by FDA under an Emergency Use Authorization (EUA). This EUA will remain  in effect (meaning this test can be used) for the duration of the COVID-19 declaration under Section 564(b)(1) of the Act, 21 U.S.C.section 360bbb-3(b)(1), unless the authorization is terminated  or revoked sooner.       Influenza A by PCR NEGATIVE NEGATIVE Final   Influenza B by PCR NEGATIVE NEGATIVE Final    Comment: (NOTE) The Xpert Xpress SARS-CoV-2/FLU/RSV plus assay is intended as an aid in the diagnosis of influenza from Nasopharyngeal swab specimens and should not be used as a sole basis for treatment. Nasal washings and aspirates are unacceptable for Xpert Xpress SARS-CoV-2/FLU/RSV testing.  Fact Sheet for Patients: BloggerCourse.comhttps://www.fda.gov/media/152166/download  Fact Sheet for Healthcare Providers: SeriousBroker.ithttps://www.fda.gov/media/152162/download  This test is not yet approved or cleared by the [place] FDA and has been authorized for detection and/or diagnosis of SARS-CoV-2 by FDA under an Emergency Use Authorization (EUA). This EUA will remain in effect (meaning this test can be used) for the duration of the COVID-19 declaration under Section 564(b)(1) of the Act, 21 U.S.C. section 360bbb-3(b)(1), unless the authorization is terminated or revoked.  Performed at [facility], [address] [place], [place] [address]   Blood culture (routine x 2)     Status: None   Collection Time: 10/09/20  3:35 PM   Specimen: BLOOD RIGHT HAND  Result Value Ref Range Status   Specimen Description   Final    BLOOD RIGHT HAND Performed at [facility], [address] [place], [place] [address]    Special Requests   Final    BOTTLES DRAWN AEROBIC AND ANAEROBIC  Blood Culture adequate volume Performed at [facility], [address], [place], [place] [address]    Culture   Final    NO GROWTH 5 DAYS Performed at [facility], [address] [place], [place] [address]    Report Status 10/14/2020 FINAL  Final  SARS CORONAVIRUS 2 (TAT 6-24 HRS) Nasopharyngeal Nasopharyngeal Swab     Status: None   Collection Time: 10/14/20  1:05 PM   Specimen: Nasopharyngeal Swab  Result Value Ref Range Status   SARS Coronavirus 2 NEGATIVE NEGATIVE Final    Comment: (NOTE) SARS-CoV-2 target nucleic acids are NOT DETECTED.  The SARS-CoV-2 RNA is generally detectable in upper and lower respiratory specimens during the acute phase of infection. Negative results do not preclude SARS-CoV-2 infection, do not rule out co-infections with other pathogens, and should not be used as the sole basis for treatment or other patient management decisions. Negative results must be combined with clinical observations, patient history, and epidemiological information. The expected result is Negative.  Fact Sheet for Patients: HairSlick.no  Fact Sheet for Healthcare Providers: quierodirigir.com  This test is not yet approved or cleared by the [place] FDA and  has been authorized for detection and/or diagnosis of SARS-CoV-2 by FDA under an Emergency Use Authorization (EUA). This EUA will remain  in effect (meaning this test can be used) for the duration of the COVID-19 declaration under Se ction 564(b)(1) of the Act, 21 U.S.C. section 360bbb-3(b)(1), unless the authorization is terminated or revoked sooner.  Performed at [facility], [address] [place], [place] [address]      Labs: BNP (last 3 results) No results for input(s): BNP in the last 8760 hours. Basic Metabolic Panel: Recent Labs  Lab 10/10/20 0752 10/11/20 0651 10/12/20 0427 10/14/20 0908 10/15/20 0359  NA  137 140 141 140 137  K 3.8 4.0 3.8 3.6 4.3  CL 103 106 106 108 105  CO2 22 25 24 22 23   GLUCOSE 178* 128* 106* 118* 135*  BUN 17 23 22 18 13   CREATININE 1.09 1.03 1.05 0.95 1.00  CALCIUM 9.2 9.5 9.3 9.6 8.9   Liver Function Tests: Recent Labs  Lab 10/09/20 1235 10/14/20 0908 10/15/20 0359  AST 18 16 12*  ALT 16 27 23   ALKPHOS 79 73 57  BILITOT 1.8* 1.1 0.7  PROT 9.0* 7.9 6.7  ALBUMIN 4.6 4.1 3.3*   Recent Labs  Lab 10/09/20 1235 10/14/20 0908  LIPASE 22 23   No results for input(s): AMMONIA in the last 168 hours. CBC: Recent Labs  Lab 10/09/20 1235 10/10/20 0752 10/11/20 0651 10/14/20 0908 10/15/20 0359  WBC 10.7* 10.6* 17.0* 9.4 10.0  NEUTROABS  --   --   --  7.1  --   HGB 13.5 11.9* 11.5* 13.1 11.2*  HCT 40.0 35.7* 34.1* 39.2 34.0*  MCV 84.4 85.2 84.4 84.1 84.8  PLT 223 171 177 266 224   Cardiac Enzymes: No results for input(s): CKTOTAL, CKMB, CKMBINDEX, TROPONINI in the last 168 hours. BNP: Invalid input(s): POCBNP CBG: No results for input(s): GLUCAP in the last 168 hours. D-Dimer No results for input(s): DDIMER in the last 72 hours. Hgb A1c No results for input(s): HGBA1C in the last 72 hours. Lipid Profile No results for input(s): CHOL, HDL, LDLCALC, TRIG, CHOLHDL, LDLDIRECT in the  last 72 hours. Thyroid function studies No results for input(s): TSH, T4TOTAL, T3FREE, THYROIDAB in the last 72 hours.  Invalid input(s): FREET3 Anemia work up No results for input(s): VITAMINB12, FOLATE, FERRITIN, TIBC, IRON, RETICCTPCT in the last 72 hours. Urinalysis    Component Value Date/Time   COLORURINE YELLOW 10/14/2020 0909   APPEARANCEUR CLEAR 10/14/2020 0909   LABSPEC 1.013 10/14/2020 0909   PHURINE 5.0 10/14/2020 0909   GLUCOSEU NEGATIVE 10/14/2020 0909   HGBUR NEGATIVE 10/14/2020 0909   BILIRUBINUR NEGATIVE 10/14/2020 0909   KETONESUR NEGATIVE 10/14/2020 0909   PROTEINUR NEGATIVE 10/14/2020 0909   NITRITE NEGATIVE 10/14/2020 0909   LEUKOCYTESUR  NEGATIVE 10/14/2020 0909   Sepsis Labs Invalid input(s): PROCALCITONIN,  WBC,  LACTICIDVEN Microbiology Recent Results (from the past 240 hour(s))  Urine culture     Status: Abnormal   Collection Time: 10/09/20  1:00 PM   Specimen: Urine, Random  Result Value Ref Range Status   Specimen Description   Final    URINE, RANDOM Performed at [facility], [address] [place], [place] [address]    Special Requests   Final    NONE Performed at [facility], [address], [place], [place] [address]    Culture >=100,000 COLONIES/mL ESCHERICHIA COLI (A)  Final   Report Status 10/12/2020 FINAL  Final   Organism ID, Bacteria ESCHERICHIA COLI (A)  Final      Susceptibility   Escherichia coli - MIC*    AMPICILLIN <=2 SENSITIVE Sensitive     CEFAZOLIN <=4 SENSITIVE Sensitive     CEFEPIME <=0.12 SENSITIVE Sensitive     CEFTRIAXONE <=0.25 SENSITIVE Sensitive     CIPROFLOXACIN <=0.25 SENSITIVE Sensitive     GENTAMICIN <=1 SENSITIVE Sensitive     IMIPENEM <=0.25 SENSITIVE Sensitive     NITROFURANTOIN <=16 SENSITIVE Sensitive     TRIMETH/SULFA <=20 SENSITIVE Sensitive     AMPICILLIN/SULBACTAM <=2 SENSITIVE Sensitive     PIP/TAZO <=4 SENSITIVE Sensitive     * >=100,000 COLONIES/mL ESCHERICHIA COLI  Blood culture (routine x 2)     Status: None   Collection Time: 10/09/20  3:25 PM   Specimen: BLOOD  Result Value Ref Range Status   Specimen Description   Final    BLOOD RIGHT ANTECUBITAL Performed at [facility], [address], [place], [place] [address]    Special Requests   Final    BOTTLES DRAWN AEROBIC AND ANAEROBIC Blood Culture results may not be optimal due to an inadequate volume of blood received in culture bottles Performed at [facility], [address] [place], [place] [address]    Culture   Final    NO GROWTH 5 DAYS Performed at [facility], [address] [place], [place] [address]     Report Status 10/14/2020 FINAL  Final  Resp Panel by RT-PCR (Flu A&B, Covid) Nasopharyngeal Swab     Status: None   Collection Time: 10/09/20  3:32 PM   Specimen: Nasopharyngeal Swab; Nasopharyngeal(NP) swabs in vial transport medium  Result Value Ref Range Status   SARS Coronavirus 2 by RT PCR NEGATIVE NEGATIVE Final    Comment: (NOTE) SARS-CoV-2 target nucleic acids are NOT DETECTED.  The SARS-CoV-2 RNA is generally detectable in upper respiratory specimens during the acute phase of infection. The lowest concentration of SARS-CoV-2 viral copies this assay can detect is 138 copies/mL. A negative result does not preclude SARS-Cov-2 infection and should not be used as the sole  basis for treatment or other patient management decisions. A negative result may occur with  improper specimen collection/handling, submission of specimen other than nasopharyngeal swab, presence of viral mutation(s) within the areas targeted by this assay, and inadequate number of viral copies(<138 copies/mL). A negative result must be combined with clinical observations, patient history, and epidemiological information. The expected result is Negative.  Fact Sheet for Patients:  BloggerCourse.com  Fact Sheet for Healthcare Providers:  SeriousBroker.it  This test is no t yet approved or cleared by the [place] FDA and  has been authorized for detection and/or diagnosis of SARS-CoV-2 by FDA under an Emergency Use Authorization (EUA). This EUA will remain  in effect (meaning this test can be used) for the duration of the COVID-19 declaration under Section 564(b)(1) of the Act, 21 U.S.C.section 360bbb-3(b)(1), unless the authorization is terminated  or revoked sooner.       Influenza A by PCR NEGATIVE NEGATIVE Final   Influenza B by PCR NEGATIVE NEGATIVE Final    Comment: (NOTE) The Xpert Xpress SARS-CoV-2/FLU/RSV plus assay is intended as an aid in  the diagnosis of influenza from Nasopharyngeal swab specimens and should not be used as a sole basis for treatment. Nasal washings and aspirates are unacceptable for Xpert Xpress SARS-CoV-2/FLU/RSV testing.  Fact Sheet for Patients: BloggerCourse.com  Fact Sheet for Healthcare Providers: SeriousBroker.it  This test is not yet approved or cleared by the [place] FDA and has been authorized for detection and/or diagnosis of SARS-CoV-2 by FDA under an Emergency Use Authorization (EUA). This EUA will remain in effect (meaning this test can be used) for the duration of the COVID-19 declaration under Section 564(b)(1) of the Act, 21 U.S.C. section 360bbb-3(b)(1), unless the authorization is terminated or revoked.  Performed at [facility], [address], [place], [place] [address]   Blood culture (routine x 2)     Status: None   Collection Time: 10/09/20  3:35 PM   Specimen: BLOOD RIGHT HAND  Result Value Ref Range Status   Specimen Description   Final    BLOOD RIGHT HAND Performed at [facility], [address] [place], [place] [address]    Special Requests   Final    BOTTLES DRAWN AEROBIC AND ANAEROBIC Blood Culture adequate volume Performed at [facility], [address], [place], [place] [address]    Culture   Final    NO GROWTH 5 DAYS Performed at [facility], [address] [place], [place] [address]    Report Status 10/14/2020 FINAL  Final  SARS CORONAVIRUS 2 (TAT 6-24 HRS) Nasopharyngeal Nasopharyngeal Swab     Status: None   Collection Time: 10/14/20  1:05 PM   Specimen: Nasopharyngeal Swab  Result Value Ref Range Status   SARS Coronavirus 2 NEGATIVE NEGATIVE Final    Comment: (NOTE) SARS-CoV-2 target nucleic acids are NOT DETECTED.  The SARS-CoV-2 RNA is generally detectable in upper and lower respiratory specimens during the acute phase of  infection. Negative results do not preclude SARS-CoV-2 infection, do not rule out co-infections with other pathogens, and should not be used as the sole basis for treatment or other patient management decisions. Negative results must be combined with clinical observations, patient history, and epidemiological information. The expected result is Negative.  Fact Sheet for Patients: HairSlick.no  Fact Sheet for Healthcare Providers: quierodirigir.com  This test is not yet approved or cleared by the [place] FDA and  has been authorized for detection  and/or diagnosis of SARS-CoV-2 by FDA under an Emergency Use Authorization (EUA). This EUA will remain  in effect (meaning this test can be used) for the duration of the COVID-19 declaration under Se ction 564(b)(1) of the Act, 21 U.S.C. section 360bbb-3(b)(1), unless the authorization is terminated or revoked sooner.  Performed at [facility], [address] [place], [place] [address]      Time coordinating discharge: Over 30 minutes  SIGNED:   Alvira Philips [place], DO  [facility] 10/15/2020, 10:15 AM

## 2020-10-15 NOTE — Progress Notes (Signed)
SATURATION QUALIFICATIONS: (This note is used to comply with regulatory documentation for home oxygen)  Patient Saturations on Room Air at Rest = 91-92%  Patient Saturations on Room Air while Ambulating = 92-93%  Patient Saturations on 0 Liters of oxygen while Ambulating = 92-93%  Please briefly explain why patient needs home oxygen:  Pt does not appear to need O2.

## 2020-10-15 NOTE — Plan of Care (Signed)
  Problem: Education: Goal: Knowledge of disease or condition will improve Outcome: Progressing   Problem: Education: Goal: Knowledge of General Education information will improve Description: Including pain rating scale, medication(s)/side effects and non-pharmacologic comfort measures Outcome: Progressing   Problem: Health Behavior/Discharge Planning: Goal: Ability to manage health-related needs will improve Outcome: Progressing   Problem: Clinical Measurements: Goal: Respiratory complications will improve Outcome: Progressing   Problem: Nutrition: Goal: Adequate nutrition will be maintained Outcome: Progressing

## 2020-11-16 ENCOUNTER — Inpatient Hospital Stay: Payer: Self-pay | Admitting: Physician Assistant

## 2020-12-27 NOTE — Progress Notes (Deleted)
Patient ID: Dalton Parrish, male   DOB: [birth year], 70 y.o.   MRN: [id number]     Admit date: 10/14/2020 Discharge date: 10/15/2020  Admitted From: Home Disposition: Home  Recommendations for Outpatient Follow-up:  1. Follow up with PCP in 1-2 weeks 2. Encourage patient to pick up his Eliquis for recent diagnosis of pulmonary embolism 3. Tinea prednisone 40 mg p.o. daily x4 additional days.   History of present illness:  Dalton Parrish is a 70 year old male with past medical history significant for recent diagnosis of pulmonary embolism, tobacco use disorder, essential hypertension who presented to the emergency department with complaint of right-sided abdominal/chest discomfort.  Same presentation a few days ago in which she was diagnosed with COPD exacerbation and acute right-sided pulmonary embolism.  Patient states he was unable to pick up his medications that were prescribed to him due to cost.  Pain is described as a sharp pain localized to the right chest wall area that is worse with deep breaths.  No other complaints or concerns at this time.   In the ED, temperature 98.7 F, HR 101, RR 22, BP 133/89, SPO2 97% on room air.  Sodium 140, potassium 3.6, chloride 108, CO2 22, glucose 118, BUN 18, creatinine 0.95, AST 16, ALT 27, total bilirubin 1.1.  High-sensitivity troponin 4>3.  WBC 9.4, hemoglobin 13.1, platelets 266.  Urinalysis unrevealing.  Chest x-ray with small right pleural effusion and right greater than left basilar airspace disease likely secondary to atelectasis.  CT abdomen/pelvis with contrast with moderate bilateral groundglass opacities, atelectasis, small right pleural effusion, consolidation likely secondary to pulmonary infarction and no acute process in the abdomen/pelvis.  Patient was started back on Eliquis and started on antimicrobial coverage for concern of pneumonia.  Hospital service consulted for further evaluation and management.   Hospital course:  Acute  right-sided pulmonary embolism Patient recently discharged on 10/12/2020 with new finding of right-sided pulmonary embolism.  Lower extremity ultrasound during that admission negative for DVT.  Patient was discharged on Eliquis, but unfortunately did not pick up his medication due to cost and not receiving discount card.  Was seen by social work and discussed with his pharmacy which should have no cost.  Patient encouraged to continue his Eliquis and pick up his prescription as previously prescribed.  There is initial concern for possible commune acquired pneumonia, patient afebrile without leukocytosis and procalcitonin within normal limits.  CT abdomen/pelvis findings of consolidation most likely related to atelectasis and pulmonary infarction on the right side.  Antibiotics were discontinued.  Patient will need age-appropriate cancer screenings such as colonoscopy outpatient.  Follow-up has been arranged with the community wellness center.  COPD exacerbation Has been titrated off of submental oxygen.  Continue prednisone 40 mg p.o. daily to complete 5-day course.  Albuterol MDI as needed.  Tobacco use disorder Continue to encourage tobacco cessation.  Patient reports quit smoking about 1 month ago.  Essential hypertension Continue losartan 50 mg p.o. daily.  Outpatient follow-up with community health and wellness center.  Discharge Diagnoses:  Active Problems:   Other pulmonary embolism without acute cor pulmonale (HCC)   Pulmonary infarct (HCC)

## 2020-12-28 ENCOUNTER — Inpatient Hospital Stay: Payer: Self-pay | Admitting: Physician Assistant

## 2022-03-15 IMAGING — CT CT ANGIO CHEST
2 of 6 series · 18 of 36 positions shown · IV contrast (omnipaque)
Comparison: None.

CLINICAL DATA: Shortness of breath and wheezing.

EXAM:
CT ANGIOGRAPHY CHEST WITH CONTRAST
TECHNIQUE: Multidetector CT imaging of the chest was performed using the
standard protocol during bolus administration of intravenous
contrast. Multiplanar CT image reconstructions and MIPs were
obtained to evaluate the vascular anatomy.
CONTRAST:  75mL OMNIPAQUE IOHEXOL 350 MG/ML SOLN

[Series 5: thins · axial · 0.72mm/px · z∈[+1290,+1547]mm · 17 of 291 slices shown]
[im 17/291  lung]
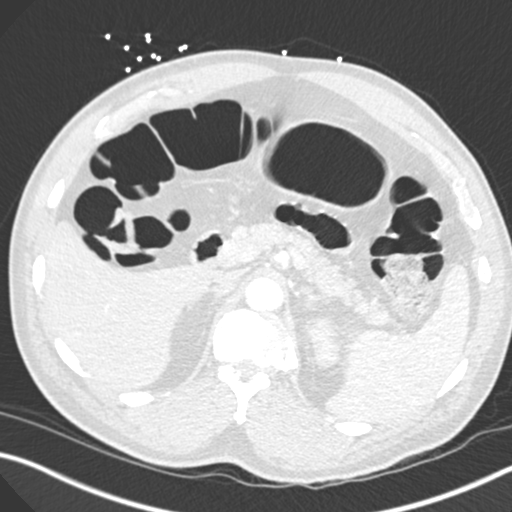
[im 33/291  mediastinal]
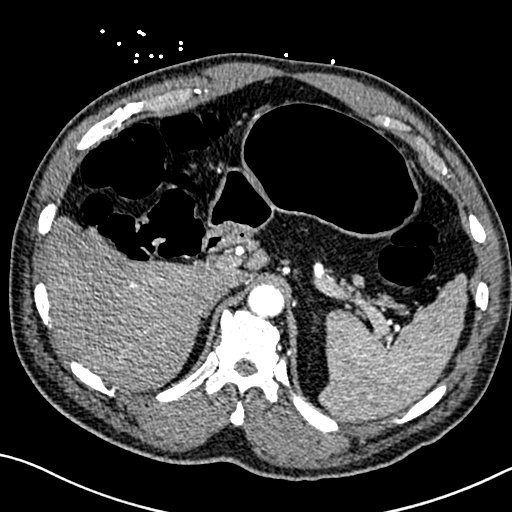
[im 49/291  lung]
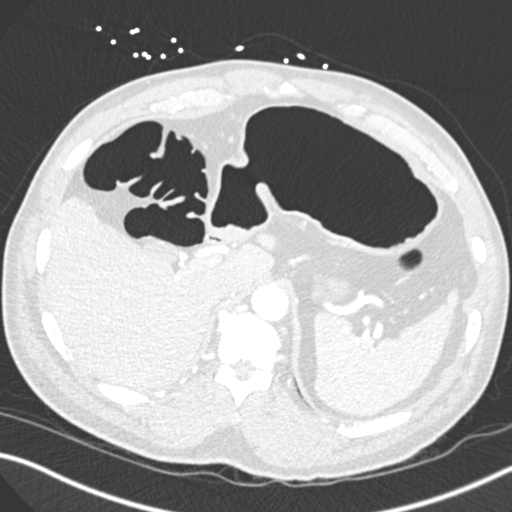
[im 65/291  mediastinal]
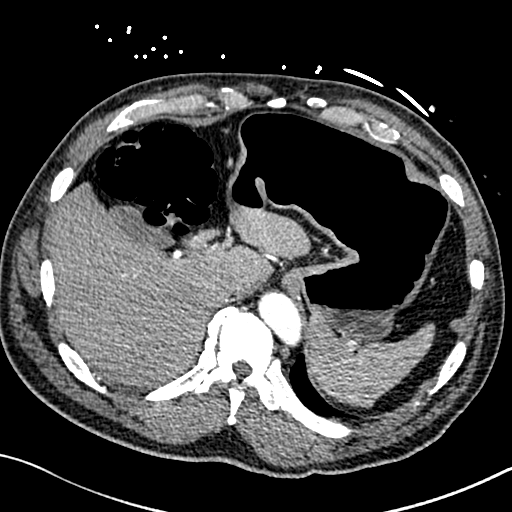
[im 81/291  lung]
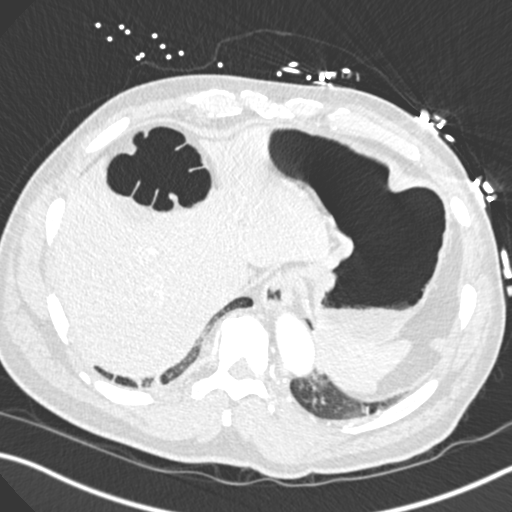
[im 97/291  mediastinal]
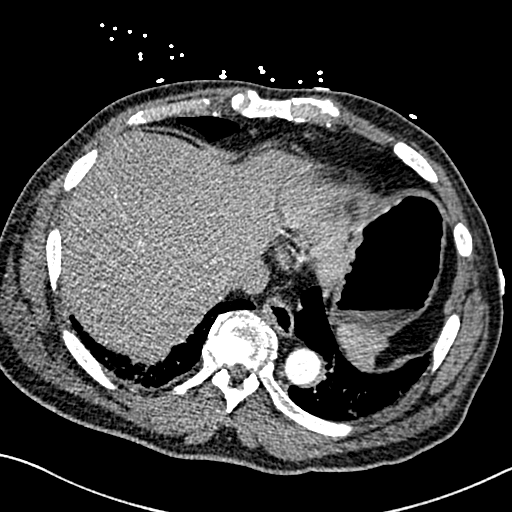
[im 113/291  lung]
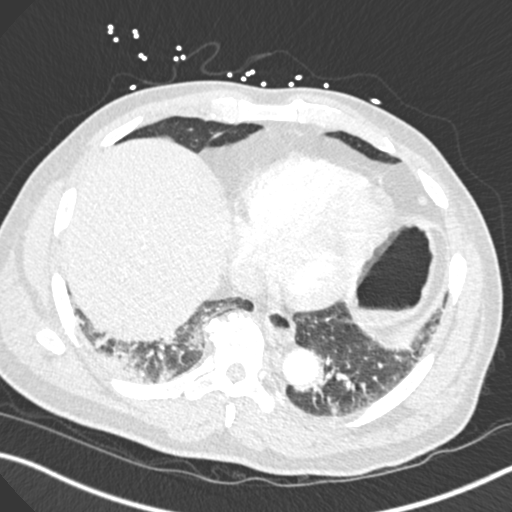
[im 129/291  mediastinal]
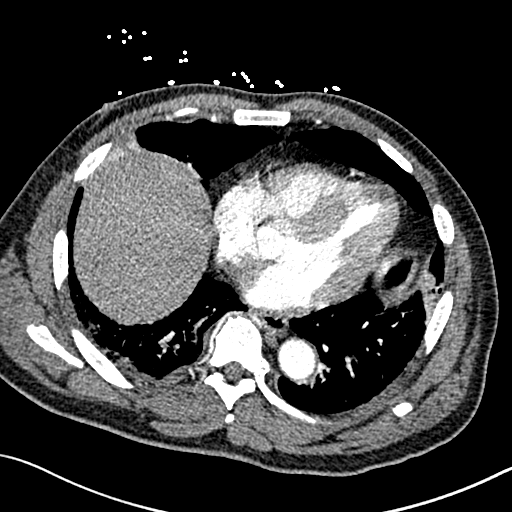
[im 146/291  lung]
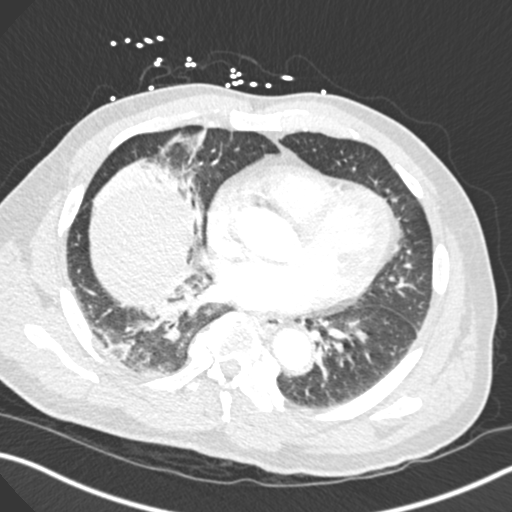
[im 162/291  mediastinal]
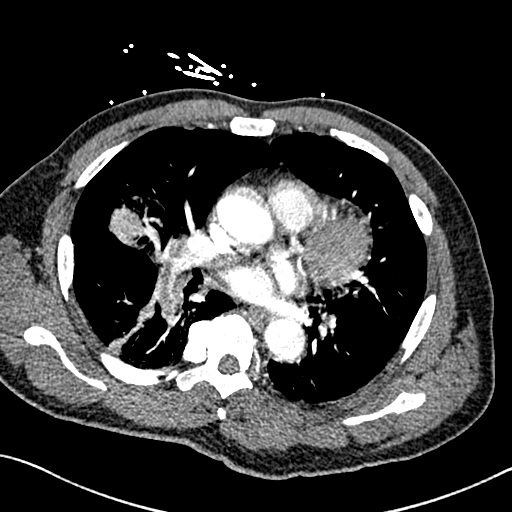
[im 178/291  lung]
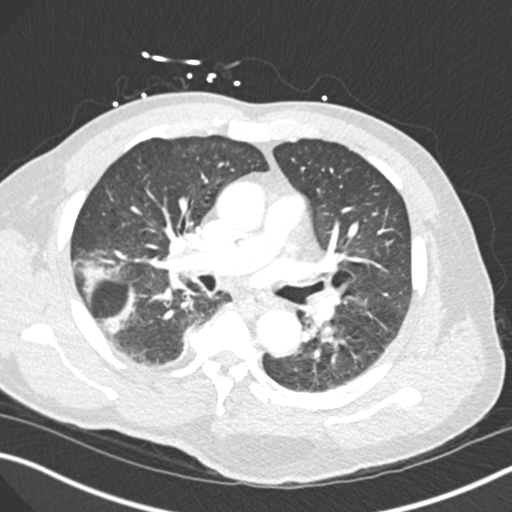
[im 194/291  mediastinal]
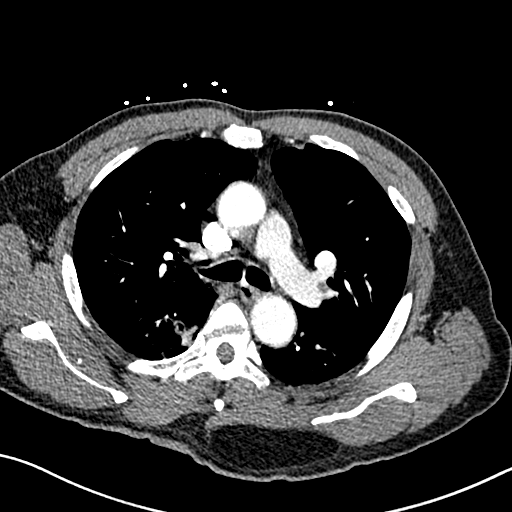
[im 210/291  lung]
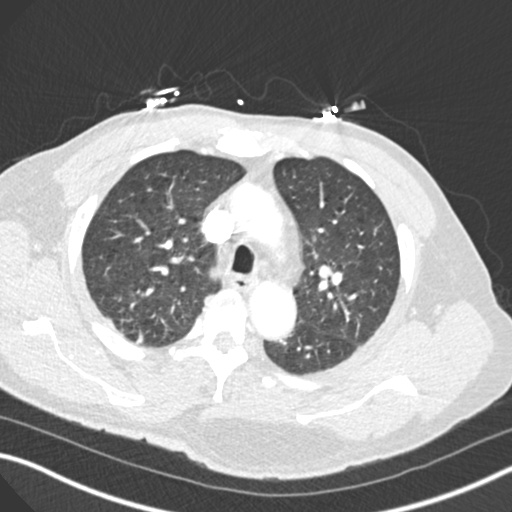
[im 226/291  mediastinal]
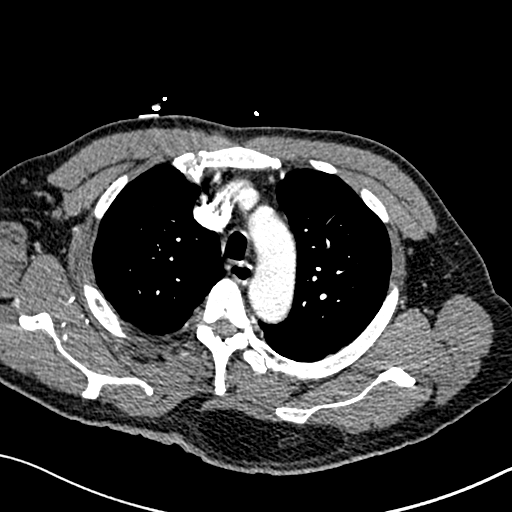
[im 242/291  lung]
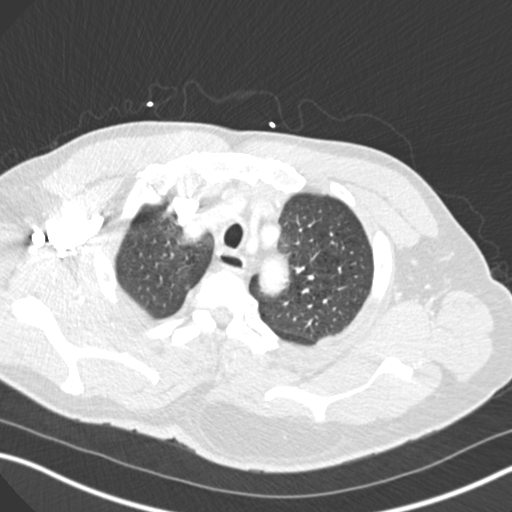
[im 258/291  mediastinal]
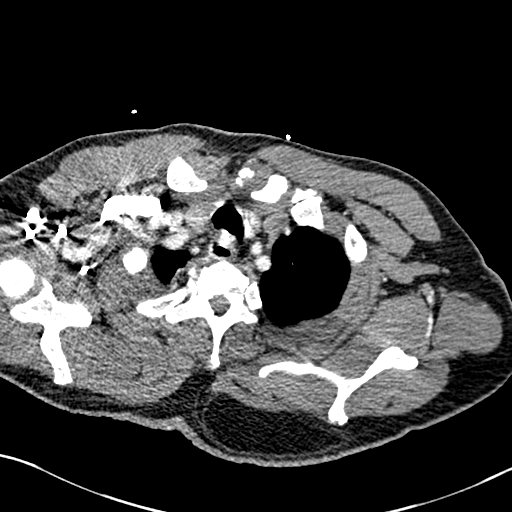
[im 274/291  lung]
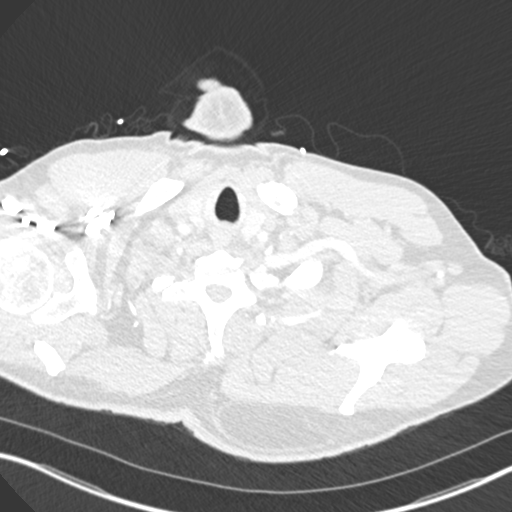

[Series 7: coronal mpr · coronal · 0.65mm/px · 1 of 151 slices shown]
[im 76/151  mediastinal]
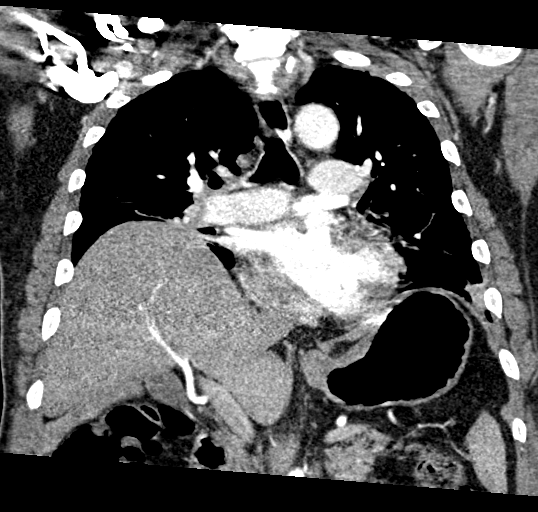

[18 of 36 positions shown; findings below may reference images not displayed]

FINDINGS: Cardiovascular: The thoracic aorta is normal in appearance. Marked
severity intraluminal filling defects are seen involving the distal
aspect of the right pulmonary artery. Extensive involvement of the
middle lobe and lower lobe branches is seen. There is no evidence of
saddle embolus. Normal heart size without evidence of right heart
strain. No pericardial effusion.

Mediastinum/Nodes: No enlarged mediastinal, hilar, or axillary lymph
nodes. Thyroid gland, trachea, and esophagus demonstrate no
significant findings.

Lungs/Pleura: There is mild emphysematous lung disease.

Mild areas of atelectasis and/or infiltrate are seen within the
inferior aspect of the left upper lobe and posterior aspects of the
right upper lobe, right middle lobe and left lung base. Moderate
severity atelectasis and/or infiltrate is seen within the right
lower lobe.

There is no evidence of a pleural effusion or pneumothorax.

Upper Abdomen: There is a small hiatal hernia.

Musculoskeletal: A 10.5 cm x 3.2 cm subcutaneous lipoma is seen
within the soft tissues of the back, to the left of midline.

Degenerative changes seen throughout the thoracic spine.

Review of the MIP images confirms the above findings.
IMPRESSION: 1. Marked severity right-sided pulmonary embolism without evidence
of saddle embolus or right heart strain.
2. Mild to moderate severity bilateral atelectasis and/or
infiltrate.
3. Small hiatal hernia.
4. Emphysema.

Emphysema ([id number]).

## 2022-03-20 IMAGING — DX DG CHEST 1V PORT
1 series · 1 of 1 positions shown · non-contrast
Comparison: CT abdomen and pelvis today.

CLINICAL DATA: Shortness of breath and right side abdominal pain.

EXAM:
PORTABLE CHEST 1 VIEW

[chest ap]
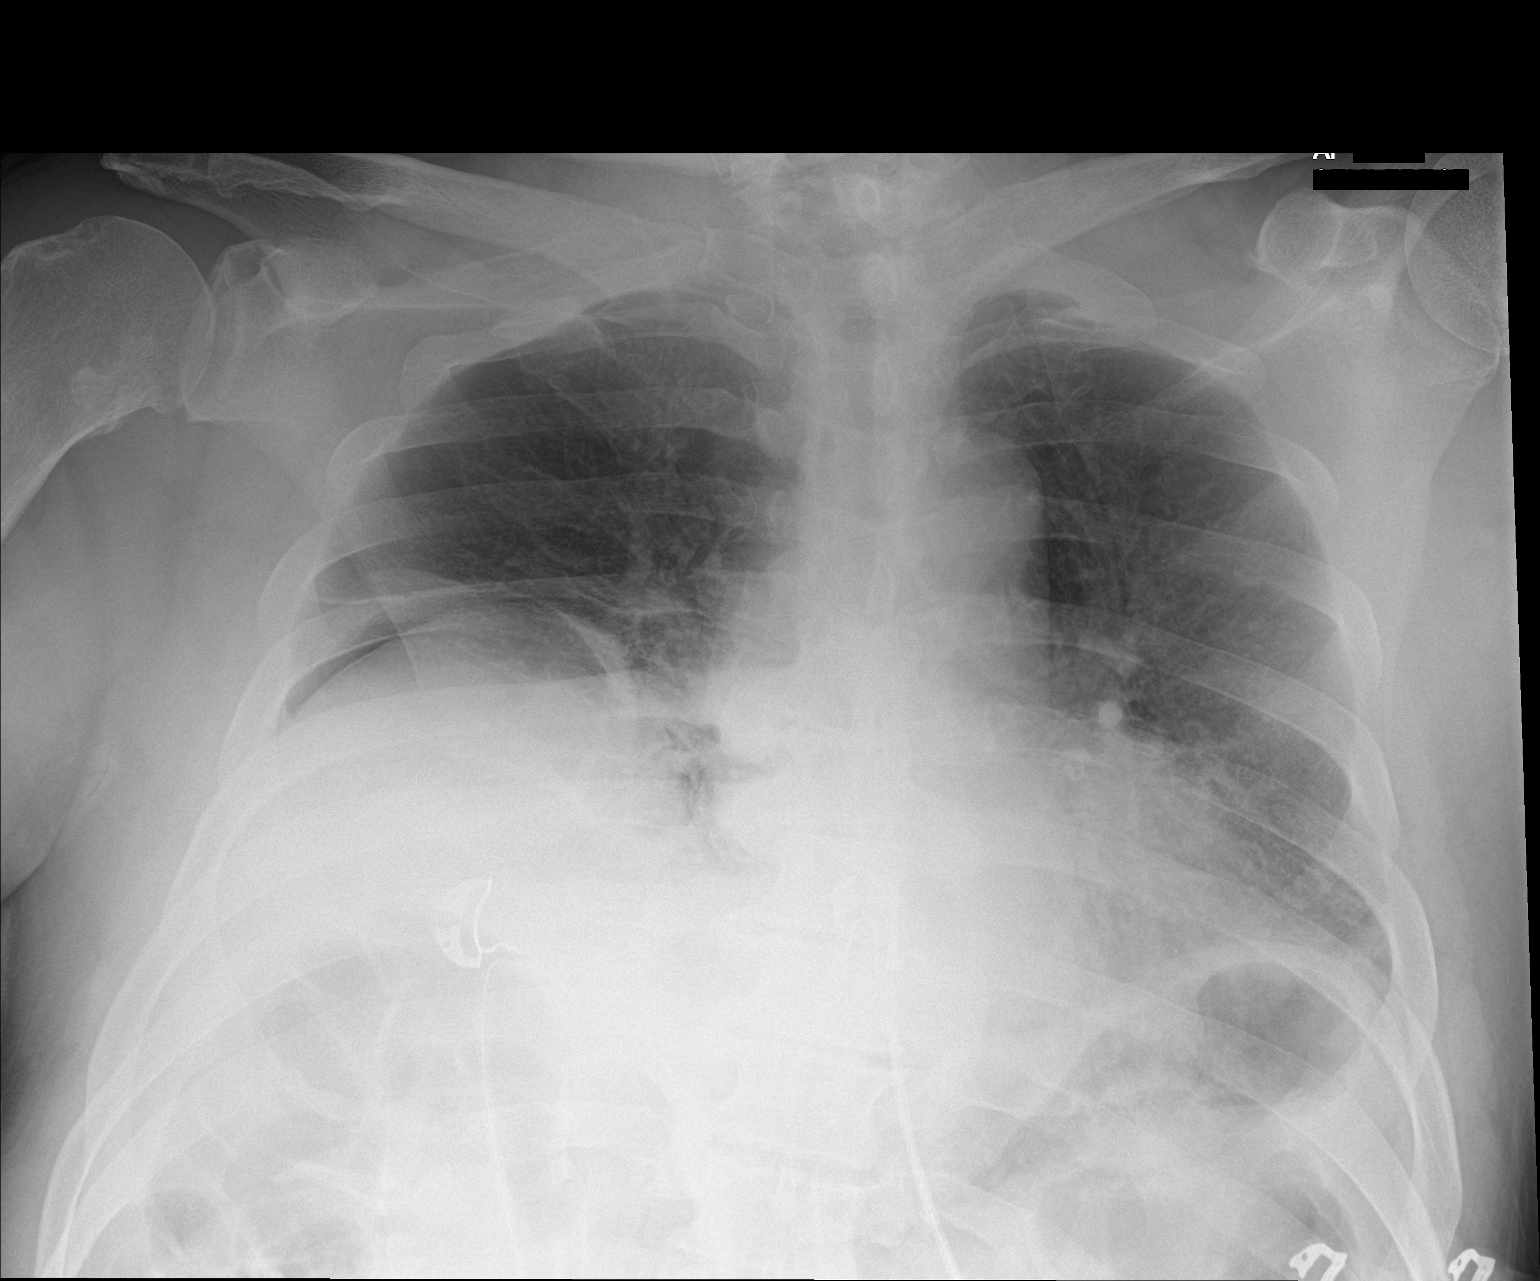

[1 of 1 positions shown; findings below may reference images not displayed]

FINDINGS: Small right pleural effusion and right greater than left basilar
airspace disease are identified as seen on CT. No pneumothorax.
Heart size is upper normal. No acute or focal bony abnormality.
IMPRESSION: Small right pleural effusion and right greater than left basilar
airspace disease which could be due to atelectasis or pneumonia.
